# Patient Record
Sex: Female | Born: 1990 | Race: Black or African American | Hispanic: No | Marital: Single | Smoking: Never smoker
Health system: Southern US, Community
[De-identification: ages and names within clinical notes are randomized; demographics above are authoritative.]

## PROBLEM LIST (undated history)

## (undated) DIAGNOSIS — D649 Anemia, unspecified: Secondary | ICD-10-CM

## (undated) DIAGNOSIS — G43909 Migraine, unspecified, not intractable, without status migrainosus: Secondary | ICD-10-CM

## (undated) HISTORY — DX: Anemia, unspecified: D64.9

## (undated) HISTORY — DX: Migraine, unspecified, not intractable, without status migrainosus: G43.909

---
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
## 2008-06-02 ENCOUNTER — Emergency Department (HOSPITAL_COMMUNITY): Admission: EM | Admit: 2008-06-02 | Discharge: 2008-06-02 | Payer: Self-pay | Admitting: Emergency Medicine

## 2009-07-01 ENCOUNTER — Emergency Department (HOSPITAL_COMMUNITY): Admission: EM | Admit: 2009-07-01 | Discharge: 2009-07-01 | Payer: Self-pay | Admitting: Emergency Medicine

## 2010-03-08 ENCOUNTER — Emergency Department (HOSPITAL_COMMUNITY): Admission: EM | Admit: 2010-03-08 | Discharge: 2010-03-09 | Payer: Self-pay | Admitting: Emergency Medicine

## 2010-10-02 ENCOUNTER — Emergency Department (HOSPITAL_COMMUNITY): Admission: EM | Admit: 2010-10-02 | Discharge: 2010-10-02 | Payer: Self-pay | Admitting: Emergency Medicine

## 2010-12-25 DIAGNOSIS — G43909 Migraine, unspecified, not intractable, without status migrainosus: Secondary | ICD-10-CM

## 2010-12-25 HISTORY — DX: Migraine, unspecified, not intractable, without status migrainosus: G43.909

## 2011-02-01 ENCOUNTER — Emergency Department (HOSPITAL_COMMUNITY): Payer: BC Managed Care – PPO

## 2011-02-01 ENCOUNTER — Emergency Department (HOSPITAL_COMMUNITY)
Admission: EM | Admit: 2011-02-01 | Discharge: 2011-02-01 | Disposition: A | Discharge status: Home / Self Care | Payer: BC Managed Care – PPO | Attending: Emergency Medicine | Admitting: Emergency Medicine

## 2011-02-01 DIAGNOSIS — R079 Chest pain, unspecified: Secondary | ICD-10-CM | POA: Insufficient documentation

## 2011-03-24 ENCOUNTER — Other Ambulatory Visit (INDEPENDENT_AMBULATORY_CARE_PROVIDER_SITE_OTHER): Payer: BC Managed Care – PPO

## 2011-03-24 ENCOUNTER — Ambulatory Visit (INDEPENDENT_AMBULATORY_CARE_PROVIDER_SITE_OTHER): Payer: BC Managed Care – PPO | Admitting: Internal Medicine

## 2011-03-24 ENCOUNTER — Encounter: Payer: Self-pay | Admitting: Internal Medicine

## 2011-03-24 DIAGNOSIS — I319 Disease of pericardium, unspecified: Secondary | ICD-10-CM | POA: Insufficient documentation

## 2011-03-24 DIAGNOSIS — R079 Chest pain, unspecified: Secondary | ICD-10-CM

## 2011-03-24 DIAGNOSIS — D649 Anemia, unspecified: Secondary | ICD-10-CM

## 2011-03-24 DIAGNOSIS — R0781 Pleurodynia: Secondary | ICD-10-CM | POA: Insufficient documentation

## 2011-03-24 LAB — CBC WITH DIFFERENTIAL/PLATELET
Basophils Relative: 0.4 % (ref 0.0–3.0)
Eosinophils Relative: 1.6 % (ref 0.0–5.0)
HCT: 39.2 % (ref 36.0–46.0)
Hemoglobin: 12.3 g/dL (ref 12.0–15.0)
Lymphs Abs: 1.3 10*3/uL (ref 0.7–4.0)
MCV: 77.6 fl — ABNORMAL LOW (ref 78.0–100.0)
Monocytes Absolute: 0.3 10*3/uL (ref 0.1–1.0)
Monocytes Relative: 8.5 % (ref 3.0–12.0)
Neutro Abs: 2.2 10*3/uL (ref 1.4–7.7)
Platelets: 205 10*3/uL (ref 150.0–400.0)
RBC: 5.05 Mil/uL (ref 3.87–5.11)
WBC: 3.9 10*3/uL — ABNORMAL LOW (ref 4.5–10.5)

## 2011-03-24 LAB — COMPREHENSIVE METABOLIC PANEL
Alkaline Phosphatase: 47 U/L (ref 39–117)
BUN: 8 mg/dL (ref 6–23)
CO2: 29 mEq/L (ref 19–32)
Creatinine, Ser: 0.7 mg/dL (ref 0.4–1.2)
GFR: 130.86 mL/min (ref >60.00)
Glucose, Bld: 94 mg/dL (ref 70–99)
Total Bilirubin: 0.6 mg/dL (ref 0.3–1.2)
Total Protein: 6.6 g/dL (ref 6.0–8.3)

## 2011-03-24 LAB — SEDIMENTATION RATE: Sed Rate: 6 mm/hr (ref 0–22)

## 2011-03-24 MED ORDER — IBUPROFEN 100 MG/5ML PO SUSP: 200.0000 mg | ORAL | Status: DC | PRN

## 2011-03-24 NOTE — Progress Notes (Signed)
Subjective:    Patient ID: Lindsey Webster, female    DOB: 03/06/1991, 20 y.o.   MRN: [id number]  Chest Pain  This is a new problem. The current episode started more than 1 month ago. The onset quality is gradual. The problem occurs every several days. The problem has been unchanged. The pain is present in the substernal region. The pain is at a severity of 2/10. The pain is mild. The quality of the pain is described as sharp. The pain does not radiate. Pertinent negatives include no abdominal pain, back pain, claudication, cough, diaphoresis, dizziness, exertional chest pressure, fever, headaches, hemoptysis, irregular heartbeat, leg pain, lower extremity edema, malaise/fatigue, nausea, near-syncope, numbness, orthopnea, palpitations, PND, shortness of breath, sputum production, syncope, vomiting or weakness. The pain is aggravated by breathing and movement. She has tried nothing for the symptoms. Risk factors include no known risk factors.  Pertinent negatives for past medical history include no seizures and no valve disorder.  Pertinent negatives for family medical history include: no CAD in family, no connective tissue disease in family and no early MI in family.   She was seen at the hospital in 02/01/11 and all of her testing was normal and she tells me that she was told that she has pericarditis.   Review of Systems  Constitutional: Negative for fever, chills, malaise/fatigue, diaphoresis, activity change, appetite change, fatigue and unexpected weight change.  HENT: Negative for facial swelling and neck pain.   Respiratory: Negative for apnea, cough, hemoptysis, sputum production, choking, chest tightness, shortness of breath, wheezing and stridor.   Cardiovascular: Positive for chest pain. Negative for palpitations, orthopnea, claudication, leg swelling, syncope, PND and near-syncope.  Gastrointestinal: Negative for nausea, vomiting, abdominal pain, diarrhea, constipation and abdominal  distention.  Genitourinary: Negative for hematuria and difficulty urinating.  Musculoskeletal: Negative for back pain.  Skin: Negative for color change, pallor and rash.  Neurological: Negative for dizziness, tremors, seizures, syncope, weakness, light-headedness, numbness and headaches.  Hematological: Negative for adenopathy. Does not bruise/bleed easily.  Psychiatric/Behavioral: Negative for behavioral problems, confusion, dysphoric mood, decreased concentration and agitation.       Objective:   Physical Exam  Constitutional: She is oriented to person, place, and time. She appears well-developed and well-nourished. No distress.  HENT:  Head: Normocephalic and atraumatic.  Right Ear: External ear normal.  Left Ear: External ear normal.  Nose: Nose normal.  Mouth/Throat: Oropharynx is clear and moist. No oropharyngeal exudate.  Eyes: Conjunctivae and EOM are normal. Pupils are equal, round, and reactive to light. Right eye exhibits no discharge. Left eye exhibits no discharge. No scleral icterus.  Neck: Normal range of motion. Neck supple. No thyromegaly present.  Cardiovascular: Normal rate, regular rhythm, normal heart sounds, intact distal pulses and normal pulses.  PMI is not displaced.  Exam reveals no gallop and no friction rub.   No murmur heard. Pulmonary/Chest: Effort normal and breath sounds normal. No respiratory distress. She has no wheezes. She has no rales. She exhibits no tenderness.  Abdominal: Soft. Bowel sounds are normal. She exhibits no distension. There is no tenderness. There is no rebound and no guarding.  Musculoskeletal: Normal range of motion. She exhibits no edema and no tenderness.  Lymphadenopathy:    She has no cervical adenopathy.  Neurological: She is oriented to person, place, and time. She has normal reflexes. She displays normal reflexes. No cranial nerve deficit. She exhibits normal muscle tone. Coordination normal.  Skin: Skin is warm and dry. No  rash noted. She  is not diaphoretic. No erythema (some tatoos). No pallor.  Psychiatric: She has a normal mood and affect. Her behavior is normal. Judgment and thought content normal.          Assessment & Plan:

## 2011-03-24 NOTE — Patient Instructions (Signed)
Pericarditis Pericarditis is an inflammation (soreness or redness) of the sac (almost like a bag) surrounding the heart, starting at the large vessels at the top of the heart, shown in the picture, and wrapping down around the heart. The inside of this sac is very smooth so the heart can beat and slide easily within this protective membrane. The outside of the sac is a tougher layer of material. This sac may become inflamed by a number of different problems. Pericarditis is usually accompanied by pain in the chest which radiates to the shoulder, back, and upper and middle belly (abdomen). There may be shortness of breath and swelling of the abdomen. Sometimes fluid collects around the heart. When this happens the heart cannot beat as well. When your heart is unable to work as well, you will not feel well. You have may have shortness of breath (dyspnea) with exertion such as climbing stairs. The kidneys do not work as well so you may retain fluid. This is also one of the reasons your lower legs and ankles swell. The first sign you will usually recognize is chest pain. You will also usually get a rapid heartbeat. You may have sudden unexplained weight gain of ten to fifteen pounds. You may get short of breath while sleeping. The heart actually has to work harder while you are lying down. This may also produce a night cough. It may help to sleep with two or more pillows. With treatment these symptoms usually improve rapidly. Upon discharge from this location, weigh yourself after arriving home. Record your weight daily at the same time. This will give some indication as to your progress. As you get better your weight will usually go down. Follow a low sodium diet. CAUSES   Medicine side effects.   Radiation as received for treatment of cancer.   Tumors or cancer themselves.   Infections. These may be caused by viruses, germs (bacteria) or fungi.   Tuberculosis (this is also an infection).   Autoimmune  disorders such as lupus, scleroderma, and rheumatoid arthritis.  DIAGNOSIS The diagnosis of pericarditis is made by history (asking the patient what seems to be the problem) and physical exam (looking and listening to the patient by the caregiver). Blood tests may need to be done. Often specialized tests such as echocardiogram (pictures which are taken by bouncing sound waves off the heart), CT scans of the heart and chest, and perhaps other tests depending on what is happening, may be done. TREATMENT  Treatment of pericarditis will usually include medicine to relieve pain.   If water retention is present, water reducing pills (diuretics) may be given to get rid of the water accumulation.   If the pericarditis is caused by an infection which can be treated, medicine which kill germs (antibiotics) will be started. If the infection is caused by a fungus or tuberculosis, treatment may be necessary for very long times.   If the infection is caused by a virus it will usually run its course and cause no further problems. When a virus is the cause, anti-inflammatory medicines (medicine against soreness) will often be given.   If complications occur from pericarditis, such as scarring of the pericardial sac following the inflammation, surgery is rarely needed to remove the sac. The sac surrounding the heart is not necessary for life. When it becomes scarred, it makes it more difficult for the heart to beat normally.  HOME CARE INSTRUCTIONS  Follow the treatment plan your caregiver prescribes. They can help you  with your post hospital care program. Follow your caregiver's advice regarding dieting and exercise. Eat a low fat diet. Use alcohol only as directed. Take all medicines as directed.   Eat a nutritious diet low in fat and sodium.   Try to maintain an ideal weight. Your caregivers can help you with this.   Exercise as instructed.   Wear a medical alert bracelet if recommended by your caregiver.     Keep medicine with you, including a list with dosages, in case of an emergency.   Stop smoking.  SEEK IMMEDIATE MEDICAL CARE IF:  Chest pain.  Vomiting.   Sweating (diaphoresis).   Irregular heart beat (palpitations).   Racing heart.   Fainting episodes.   Feeling sick to your stomach (nausea).   Weakness.   If you develop any of the symptoms (complaints, problems) which originally brought you into the clinic, hospital, or emergency department, call for local emergency medical help. Do not drive yourself to the hospital. Document Released: 06/06/2001 Document Re-Released: 01/02/2010 [facility] Patient Information 2011 [place], [place].

## 2011-03-25 DIAGNOSIS — D509 Iron deficiency anemia, unspecified: Secondary | ICD-10-CM | POA: Insufficient documentation

## 2011-03-25 LAB — C-REACTIVE PROTEIN: CRP: 0 mg/dL (ref <0.6)

## 2011-03-25 NOTE — Assessment & Plan Note (Signed)
EKG is wnl, will treat the pericarditis

## 2011-03-25 NOTE — Assessment & Plan Note (Signed)
Will start some nsaids, she wants a liquid, she does not want to see a Cardiologist, I will check labs to look for an inflammatory process and/or infection, she has no s/s of tamponade, she and her parents were given pt ed material

## 2011-03-25 NOTE — Assessment & Plan Note (Signed)
She has no s/s of blood loss, will check her CBC today

## 2011-03-28 ENCOUNTER — Encounter: Payer: Self-pay | Admitting: Internal Medicine

## 2011-04-02 LAB — POCT I-STAT, CHEM 8
Creatinine, Ser: 0.8 mg/dL (ref 0.4–1.2)
Glucose, Bld: 92 mg/dL (ref 70–99)
HCT: 43 % (ref 36.0–46.0)
Hemoglobin: 14.6 g/dL (ref 12.0–15.0)
TCO2: 26 mmol/L (ref 0–100)

## 2011-04-02 LAB — URINE CULTURE
Colony Count: NO GROWTH
Culture: NO GROWTH

## 2011-04-02 LAB — URINE MICROSCOPIC-ADD ON

## 2011-04-02 LAB — URINALYSIS, ROUTINE W REFLEX MICROSCOPIC
Glucose, UA: NEGATIVE mg/dL
Hgb urine dipstick: NEGATIVE
Protein, ur: NEGATIVE mg/dL
Urobilinogen, UA: 1 mg/dL (ref 0.0–1.0)

## 2011-10-09 ENCOUNTER — Emergency Department (HOSPITAL_COMMUNITY)
Admission: EM | Admit: 2011-10-09 | Discharge: 2011-10-09 | Disposition: A | Discharge status: Home / Self Care | Payer: BC Managed Care – PPO | Attending: Emergency Medicine | Admitting: Emergency Medicine

## 2011-10-09 DIAGNOSIS — J45909 Unspecified asthma, uncomplicated: Secondary | ICD-10-CM | POA: Insufficient documentation

## 2011-10-09 DIAGNOSIS — H5789 Other specified disorders of eye and adnexa: Secondary | ICD-10-CM | POA: Insufficient documentation

## 2011-10-09 DIAGNOSIS — S0500XA Injury of conjunctiva and corneal abrasion without foreign body, unspecified eye, initial encounter: Secondary | ICD-10-CM | POA: Insufficient documentation

## 2011-10-09 DIAGNOSIS — X58XXXA Exposure to other specified factors, initial encounter: Secondary | ICD-10-CM | POA: Insufficient documentation

## 2012-01-05 ENCOUNTER — Emergency Department (HOSPITAL_COMMUNITY)
Admission: EM | Admit: 2012-01-05 | Discharge: 2012-01-06 | Disposition: A | Discharge status: Home / Self Care | Payer: BC Managed Care – PPO | Attending: Emergency Medicine | Admitting: Emergency Medicine

## 2012-01-05 ENCOUNTER — Encounter (HOSPITAL_COMMUNITY): Payer: Self-pay | Admitting: *Deleted

## 2012-01-05 DIAGNOSIS — Y990 Civilian activity done for income or pay: Secondary | ICD-10-CM | POA: Insufficient documentation

## 2012-01-05 DIAGNOSIS — J45909 Unspecified asthma, uncomplicated: Secondary | ICD-10-CM | POA: Insufficient documentation

## 2012-01-05 DIAGNOSIS — IMO0002 Reserved for concepts with insufficient information to code with codable children: Secondary | ICD-10-CM | POA: Insufficient documentation

## 2012-01-05 DIAGNOSIS — X58XXXA Exposure to other specified factors, initial encounter: Secondary | ICD-10-CM | POA: Insufficient documentation

## 2012-01-05 DIAGNOSIS — M79609 Pain in unspecified limb: Secondary | ICD-10-CM | POA: Insufficient documentation

## 2012-01-05 DIAGNOSIS — Y9289 Other specified places as the place of occurrence of the external cause: Secondary | ICD-10-CM | POA: Insufficient documentation

## 2012-01-05 DIAGNOSIS — T148XXA Other injury of unspecified body region, initial encounter: Secondary | ICD-10-CM

## 2012-01-05 MED ORDER — IBUPROFEN 600 MG PO TABS: 600.0000 mg | ORAL_TABLET | Freq: Four times a day (QID) | ORAL | Status: DC | PRN

## 2012-01-05 MED ORDER — IBUPROFEN 800 MG PO TABS
800.0000 mg | ORAL_TABLET | Freq: Once | ORAL | Status: AC
  Administered 2012-01-05: 800 mg via ORAL
  Filled 2012-01-05: qty 1

## 2012-01-05 NOTE — Progress Notes (Signed)
Orthopedic Tech Progress Note Patient Details:  Lindsey Webster [birth year] [id number]  Other Ortho Devices Type of Ortho Device: Ace wrap Ortho Device Location: left arm   Shawnie Pons 01/05/2012, 12:51 PM

## 2012-01-05 NOTE — ED Provider Notes (Signed)
History     CSN: [id number]  Arrival date & time 01/05/12  1207   First MD Initiated Contact with Patient 01/05/12 1219      Chief Complaint  Patient presents with  . Arm Injury   patient presents with left forearm pain, stated was a work injury. 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 She essentially describes repetitive motion that causes pain, injury, and strain to her left forearm. There was no direct trauma. No redness or ecchymoses. The pain extends from the proximal forearm down to the wrist. She's had no numbness, tingling, or weakness. She states she did not take any medications prior to arrival. Patient is right-handed  (Consider location/radiation/quality/duration/timing/severity/associated sxs/prior treatment) HPI  Past Medical History  Diagnosis Date  . Asthma   . Anemia     History reviewed. No pertinent past surgical history.  Family History  Problem Relation Age of Onset  . Colon cancer Mother   . Arthritis Father   . Stroke Maternal Grandfather   . Heart disease Paternal Grandmother     History  Substance Use Topics  . Smoking status: Never Smoker   . Smokeless tobacco: Not on file  . Alcohol Use: 0.6 oz/week    1 Shots of liquor per week    OB History    Grav Para Term Preterm Abortions TAB SAB Ect Mult Living                  Review of Systems  Constitutional: Negative for chills.  Musculoskeletal: Negative for back pain.  Neurological: Negative for weakness.    Allergies  Review of patient's allergies indicates no known allergies.  Home Medications   Current Outpatient Rx  Name Route Sig Dispense Refill  . IBUPROFEN 100 MG/5ML PO SUSP Oral Take 10 mLs (200 mg total) by mouth every 4 (four) hours as needed for fever. 473 mL 1    BP 130/70  Pulse 62  Temp(Src) 98.2 F (36.8 C) (Oral)  Resp 16  SpO2 99%  Physical Exam  Nursing note and vitals  reviewed. Constitutional: She appears well-developed and well-nourished. No distress.  HENT:  Head: Normocephalic.  Eyes: Pupils are equal, round, and reactive to light.  Cardiovascular: Normal heart sounds.   Pulmonary/Chest: Breath sounds normal.  Abdominal: Soft.  Musculoskeletal: Normal range of motion. She exhibits tenderness.       Examination of left forearm shows intact  Peripheral pulses, good cap refill. There is some mild tenderness in the muscles and tendons of the forearm. There is no ecchymoses, no redness, no deformity, no crepitus,  no bony deformity. There is no significant swelling appreciated.  Neurological: She is alert.  Skin: Skin is warm and dry.    ED Course  Procedures (including critical care time)  Labs Reviewed - No data to display No results found.   No diagnosis found.    MDM  Pt is seen and examined;  Initial history and physical completed.  Will follow.     Will treat for tendinitis and muscle strain. We'll provide work note for light duty for next several days. Ibuprofen ice, Ace wrap for comfort     Mikahla Wisor A. Patrica Duel, MD 01/05/12 1225

## 2012-01-05 NOTE — ED Notes (Signed)
To ed for eval of right arm pain. Unknown injury

## 2013-01-21 ENCOUNTER — Ambulatory Visit (INDEPENDENT_AMBULATORY_CARE_PROVIDER_SITE_OTHER): Payer: Managed Care, Other (non HMO) | Admitting: Physician Assistant

## 2013-01-21 VITALS — BP 111/73 | HR 99 | Temp 99.0°F | Resp 18 | Ht 63.0 in | Wt 140.0 lb

## 2013-01-21 DIAGNOSIS — J029 Acute pharyngitis, unspecified: Secondary | ICD-10-CM

## 2013-01-21 DIAGNOSIS — J111 Influenza due to unidentified influenza virus with other respiratory manifestations: Secondary | ICD-10-CM

## 2013-01-21 DIAGNOSIS — R6889 Other general symptoms and signs: Secondary | ICD-10-CM

## 2013-01-21 DIAGNOSIS — D649 Anemia, unspecified: Secondary | ICD-10-CM

## 2013-01-21 LAB — POCT CBC
Granulocyte percent: 86.9 %G — AB (ref 37–80)
MID (cbc): 0.5 (ref 0–0.9)
MPV: 10.8 fL (ref 0–99.8)
POC Granulocyte: 11.7 — AB (ref 2–6.9)
POC LYMPH PERCENT: 9.4 %L — AB (ref 10–50)
POC MID %: 3.7 %M (ref 0–12)
Platelet Count, POC: 208 10*3/uL (ref 142–424)
RBC: 4.8 M/uL (ref 4.04–5.48)
RDW, POC: 16.5 %

## 2013-01-21 LAB — FERRITIN: Ferritin: 25 ng/mL (ref 10–291)

## 2013-01-21 LAB — IRON AND TIBC
Iron: 12 ug/dL — ABNORMAL LOW (ref 42–145)
UIBC: 267 ug/dL (ref 125–400)

## 2013-01-21 MED ORDER — AMOXICILLIN 875 MG PO TABS: 875.0000 mg | ORAL_TABLET | Freq: Two times a day (BID) | ORAL | Status: DC

## 2013-01-21 MED ORDER — FERROUS SULFATE 324 (65 FE) MG PO TBEC: 324.0000 mg | DELAYED_RELEASE_TABLET | Freq: Three times a day (TID) | ORAL | Status: DC

## 2013-01-21 NOTE — Progress Notes (Signed)
Subjective:    Patient ID: Lindsey Webster, female    DOB: March 08, 1991, 22 y.o.   MRN: [id number]  HPI   Lindsey Webster is a 22 yr old female because she feels terrible.  States she has chills but feels hot, has had a headache for 2 days, body aches, sore throat.  States she has not had a fever, does not have a thermometer at home to check.  No cough.  Minimal runny nose.  Some nausea but no vomiting.  No flu shot this year.  Has not taken anything for symptoms yet.    Pt would like to be tested for diabetes today as well.  She is asymptomatic but has +fam hx for DM.   Review of Systems  Constitutional: Positive for chills. Negative for fever.  HENT: Positive for sore throat and rhinorrhea.   Respiratory: Negative for cough, shortness of breath and wheezing.   Cardiovascular: Negative.   Gastrointestinal: Positive for nausea. Negative for vomiting, abdominal pain and diarrhea.  Musculoskeletal: Positive for myalgias and arthralgias.  Skin: Negative.   Neurological: Positive for headaches.       Objective:   Physical Exam  Vitals reviewed. Constitutional: She is oriented to person, place, and time. She appears well-developed and well-nourished. No distress.  HENT:  Head: Normocephalic and atraumatic.  Right Ear: Tympanic membrane and ear canal normal.  Left Ear: Tympanic membrane and ear canal normal.  Nose: Mucosal edema and rhinorrhea present. Right sinus exhibits no maxillary sinus tenderness and no frontal sinus tenderness. Left sinus exhibits no maxillary sinus tenderness and no frontal sinus tenderness.  Mouth/Throat: Uvula is midline and mucous membranes are normal. Posterior oropharyngeal erythema present. No oropharyngeal exudate.  Eyes: Conjunctivae normal are normal. No scleral icterus.  Neck: Neck supple.  Cardiovascular: Normal rate, regular rhythm and normal heart sounds.  Exam reveals no gallop and no friction rub.   No murmur heard. Pulmonary/Chest: Effort normal and breath  sounds normal. She has no wheezes. She has no rales.  Lymphadenopathy:    She has cervical adenopathy (tender).  Neurological: She is alert and oriented to person, place, and time.  Skin: Skin is warm and dry.  Psychiatric: She has a normal mood and affect. Her behavior is normal.     Filed Vitals:   01/21/13 1006  BP: 111/73  Pulse: 99  Temp: 99 F (37.2 C)  Resp: 18     Results for orders placed in visit on 01/21/13  POCT INFLUENZA A/B      Component Value Range   Influenza A, POC Negative     Influenza B, POC Negative    POCT RAPID STREP A (OFFICE)      Component Value Range   Rapid Strep A Screen Negative  Negative  POCT CBC      Component Value Range   WBC 13.5 (*) 4.6 - 10.2 K/uL   Lymph, poc 1.3  0.6 - 3.4   POC LYMPH PERCENT 9.4 (*) 10 - 50 %L   MID (cbc) 0.5  0 - 0.9   POC MID % 3.7  0 - 12 %M   POC Granulocyte 11.7 (*) 2 - 6.9   Granulocyte percent 86.9 (*) 37 - 80 %G   RBC 4.80  4.04 - 5.48 M/uL   Hemoglobin 11.1 (*) 12.2 - 16.2 g/dL   HCT, POC 40.9 (*) 81.1 - 47.9 %   MCV 77.0 (*) 80 - 97 fL   MCH, POC 23.1 (*) 27 - 31.2 pg  MCHC 30.0 (*) 31.8 - 35.4 g/dL   RDW, POC 40.9     Platelet Count, POC 208  142 - 424 K/uL   MPV 10.8  0 - 99.8 fL        Assessment & Plan:   1. Flu-like symptoms  POCT Influenza A/B, POCT CBC  2. Sore throat  POCT rapid strep A, POCT CBC, amoxicillin (AMOXIL) 875 MG tablet, Culture, Group A Strep  3. Anemia  Ferritin, Iron and TIBC, ferrous sulfate 324 (65 FE) MG TBEC    Lindsey Webster is a 22 yr old female here with pharyngitis and anemia.  Rapid flu and rapid strep are negative today.  Throat culture sent.  CBC reveals a leukocytosis with left shift.  Will initiate amoxicillin today to cover for strep pharyngitis.  Will have her recheck in 24-48 hours to ensure she is improving.  Additionally CBC reveals a microcytic anemia.  I have added on iron studies and started her on iron supplementation.  Will follow-up on labs and make  adjustments if necessary.  Encouraged her to schedule an appointment for CPE in 4-6 weeks to discuss concern for DM and for wellness exam.  Pt understands and is in agreement.

## 2013-01-21 NOTE — Patient Instructions (Addendum)
Begin taking the antibiotic as directed.  Take with food to reduce stomach upset.  Plenty of fluids and rest.  Please return in 24-48 hours to recheck and make sure you are responding to the antibiotics.  I am sending out additional blood work because your CBC showed that you are anemic.  I will let you know what these show.  For now, begin taking the iron supplement 3 times daily between meals.  I would encouraged to schedule an appointment for a physical within the next 4-6 weeks  Please let us know if you are worsening or not improving.

## 2013-01-23 LAB — CULTURE, GROUP A STREP: Organism ID, Bacteria: NORMAL

## 2013-04-23 ENCOUNTER — Emergency Department (HOSPITAL_COMMUNITY)
Admission: EM | Admit: 2013-04-23 | Discharge: 2013-04-23 | Disposition: A | Discharge status: Home / Self Care | Payer: Managed Care, Other (non HMO) | Attending: Emergency Medicine | Admitting: Emergency Medicine

## 2013-04-23 ENCOUNTER — Encounter (HOSPITAL_COMMUNITY): Payer: Self-pay | Admitting: Emergency Medicine

## 2013-04-23 ENCOUNTER — Emergency Department (HOSPITAL_COMMUNITY): Payer: Managed Care, Other (non HMO)

## 2013-04-23 ENCOUNTER — Other Ambulatory Visit: Payer: Self-pay

## 2013-04-23 DIAGNOSIS — Z8709 Personal history of other diseases of the respiratory system: Secondary | ICD-10-CM | POA: Insufficient documentation

## 2013-04-23 DIAGNOSIS — Z862 Personal history of diseases of the blood and blood-forming organs and certain disorders involving the immune mechanism: Secondary | ICD-10-CM | POA: Insufficient documentation

## 2013-04-23 DIAGNOSIS — R209 Unspecified disturbances of skin sensation: Secondary | ICD-10-CM | POA: Insufficient documentation

## 2013-04-23 DIAGNOSIS — Z711 Person with feared health complaint in whom no diagnosis is made: Secondary | ICD-10-CM | POA: Insufficient documentation

## 2013-04-23 DIAGNOSIS — R0602 Shortness of breath: Secondary | ICD-10-CM | POA: Insufficient documentation

## 2013-04-23 NOTE — ED Notes (Signed)
See triage note. Pt reports SOB with deep inspiration. Was sent home from work yesterday because of diaphoresis and near syncope. Pt works indoors. Reports headache x3 days. Family hx of HTN. Pain 8/10 upon inspiration and headache.

## 2013-04-23 NOTE — ED Provider Notes (Signed)
History     CSN: [id number]  Arrival date & time 04/23/13  4098   First MD Initiated Contact with Patient 04/23/13 1017      Chief Complaint  Patient presents with  . Hypertension  . Numbness  . SOB with deep inspiration     (Consider location/radiation/quality/duration/timing/severity/associated sxs/prior treatment) HPI  22 year old female presents with multiple complaints. Patient reports while at work yesterday she felt shortness of breath, follows with lightheadedness, and feeling clammy. She was not exerting herself at that time. States she was sent home due to the symptoms. Her mom checked her blood pressure and was elevated with a systolic of 160s. The BP concerns her and brought her to the ER. She also reports having an in and and a throbbing headache to her for head, along with pain to the chest when she takes a deep breath. She experiencing pins and needles sensation radiates to her left arm down to the third and fourth finger. States the fingers are extra sensitive.  Patient states the tingling sensation has been lasting for the past hour since she has been in the ER.  Patient denies fever, chills, vision changes, neck pain, and dyspnea on exertion, cough, hemoptysis, and abdominal pain, nausea, vomiting, diarrhea, or weakness. She denies taking birth control pill, having recent surgery, prolonged bed rest, prior PE DVT, leg swelling or calf pain. She is a nonsmoker. She is concerned about blood pressure because she has a strong family history of hypertension. No family history of premature cardiac death. She also has a history of juvenile asthma but has not needed to use an inhaler for many years. She denies any changes in her environment.    Past Medical History  Diagnosis Date  . Asthma   . Anemia     No past surgical history on file.  Family History  Problem Relation Age of Onset  . Colon cancer Mother   . Arthritis Father   . Stroke Maternal Grandfather   . Heart  disease Paternal Grandmother     History  Substance Use Topics  . Smoking status: Never Smoker   . Smokeless tobacco: Not on file  . Alcohol Use: 0.6 oz/week    1 Shots of liquor per week    OB History   Grav Para Term Preterm Abortions TAB SAB Ect Mult Living                  Review of Systems  Constitutional:       10 Systems reviewed and all are negative for acute change except as noted in the HPI.     Allergies  Review of patient's allergies indicates no known allergies.  Home Medications  No current outpatient prescriptions on file.  BP 115/69  Pulse 57  Temp(Src) 98.3 F (36.8 C) (Oral)  Resp 16  SpO2 100%  LMP 04/16/2013  Physical Exam  Nursing note and vitals reviewed. Constitutional: She is oriented to person, place, and time. She appears well-developed and well-nourished. No distress.  Awake, alert, nontoxic appearance  HENT:  Head: Atraumatic.  Eyes: Conjunctivae are normal. Right eye exhibits no discharge. Left eye exhibits no discharge.  Neck: Neck supple.  Cardiovascular: Normal rate, regular rhythm and intact distal pulses.   Pulmonary/Chest: Effort normal. No respiratory distress. She has no wheezes. She has no rales. She exhibits no tenderness.  Abdominal: Soft. There is no tenderness. There is no rebound.  Musculoskeletal: Normal range of motion. She exhibits no edema and no tenderness.  ROM appears intact, no obvious focal weakness  Neurological: She is alert and oriented to person, place, and time.  Mental status and motor strength appears intact  Normal sensation throughout both arms along major nerve distribution.  Subjective hypethesia to 3rd-4th fingers in L hand.  Normal grip strength and FROM.     Skin: No rash noted.  Psychiatric: She has a normal mood and affect.    ED Course  Procedures (including critical care time)   Date: 04/23/2013  Rate: 58  Rhythm: normal sinus rhythm  QRS Axis: normal  Intervals: normal  ST/T Wave  abnormalities: normal  Conduction Disutrbances: none  Narrative Interpretation:   Old EKG Reviewed: No significant changes noted     10:43 AM Patient presents complaining of high blood pressure. Her blood pressure in the ED has been normotensive. She has no acute finding on exam. She is in no acute distress. Lungs clear she is no acute respiratory distress. She has no significant risk factor for PE. She has TIMI score of 0.    11:31 AM Normal ECG, normal CXR.  Reassurance given.  Return precaution discussed.  Resource provide for PCP f/u.    Labs Reviewed - No data to display Dg Chest 2 View  04/23/2013  *RADIOLOGY REPORT*  Clinical Data: Shortness of breath, left arm pain.  Dizziness.  CHEST - 2 VIEW  Comparison: 02/01/2011  Findings: Heart and mediastinal contours are within normal limits. No focal opacities or effusions.  No acute bony abnormality.  IMPRESSION: No active cardiopulmonary disease.   Original Report Authenticated By: Charlett Nose, M.D.      1. Physically well but worried       MDM  BP 115/69  Pulse 57  Temp(Src) 98.3 F (36.8 C) (Oral)  Resp 16  SpO2 100%  LMP 04/16/2013  I have reviewed nursing notes and vital signs. I personally reviewed the imaging tests through PACS system  I reviewed available ER/hospitalization records thought the EMR         Fayrene Helper, [place] 04/23/13 1134

## 2013-04-23 NOTE — ED Provider Notes (Signed)
Medical screening examination/treatment/procedure(s) were performed by non-physician practitioner and as supervising physician I was immediately available for consultation/collaboration.   Suzi Roots, MD 04/23/13 [phone]

## 2013-04-23 NOTE — ED Notes (Signed)
Pt escorted to discharge window. Pt verbalized understanding discharge instructions. In no acute distress.  

## 2013-04-23 NOTE — ED Notes (Signed)
Pt states that she is having headaches x 3 days.  Checked her blood pressure this morning 162/91.  States that now her left arm feels like pins and needles.  Has not been dx w/ HTN but stong family hx.

## 2013-05-02 ENCOUNTER — Encounter: Payer: Self-pay | Admitting: Internal Medicine

## 2013-05-02 ENCOUNTER — Other Ambulatory Visit (INDEPENDENT_AMBULATORY_CARE_PROVIDER_SITE_OTHER): Payer: Managed Care, Other (non HMO)

## 2013-05-02 ENCOUNTER — Ambulatory Visit (INDEPENDENT_AMBULATORY_CARE_PROVIDER_SITE_OTHER): Payer: Managed Care, Other (non HMO) | Admitting: Internal Medicine

## 2013-05-02 VITALS — BP 110/70 | HR 64 | Temp 98.8°F | Resp 16 | Ht 62.0 in | Wt 133.0 lb

## 2013-05-02 DIAGNOSIS — I319 Disease of pericardium, unspecified: Secondary | ICD-10-CM

## 2013-05-02 DIAGNOSIS — D509 Iron deficiency anemia, unspecified: Secondary | ICD-10-CM

## 2013-05-02 DIAGNOSIS — R079 Chest pain, unspecified: Secondary | ICD-10-CM

## 2013-05-02 LAB — IBC PANEL
Iron: 80 ug/dL (ref 42–145)
Saturation Ratios: 25.1 % (ref 20.0–50.0)
Transferrin: 227.7 mg/dL (ref 212.0–360.0)

## 2013-05-02 LAB — CBC WITH DIFFERENTIAL/PLATELET
Eosinophils Relative: 1.1 % (ref 0.0–5.0)
Lymphocytes Relative: 31.7 % (ref 12.0–46.0)
MCV: 74.8 fl — ABNORMAL LOW (ref 78.0–100.0)
Monocytes Absolute: 0.3 10*3/uL (ref 0.1–1.0)
Monocytes Relative: 5.7 % (ref 3.0–12.0)
Neutrophils Relative %: 61 % (ref 43.0–77.0)
Platelets: 222 10*3/uL (ref 150.0–400.0)
RBC: 5.21 Mil/uL — ABNORMAL HIGH (ref 3.87–5.11)
WBC: 5.1 10*3/uL (ref 4.5–10.5)

## 2013-05-02 LAB — SEDIMENTATION RATE: Sed Rate: 7 mm/hr (ref 0–22)

## 2013-05-02 MED ORDER — IBUPROFEN 100 MG/5ML PO SUSP: 200.0000 mg | ORAL | Status: DC | PRN

## 2013-05-02 MED ORDER — IBUPROFEN 600 MG PO TABS: 600.0000 mg | ORAL_TABLET | Freq: Four times a day (QID) | ORAL | Status: DC | PRN

## 2013-05-02 NOTE — Patient Instructions (Signed)
Chest Pain (Nonspecific) It is often hard to give a specific diagnosis for the cause of chest pain. There is always a chance that your pain could be related to something serious, such as a heart attack or a blood clot in the lungs. You need to follow up with your caregiver for further evaluation. CAUSES   Heartburn.  Pneumonia or bronchitis.  Anxiety or stress.  Inflammation around your heart (pericarditis) or lung (pleuritis or pleurisy).  A blood clot in the lung.  A collapsed lung (pneumothorax). It can develop suddenly on its own (spontaneous pneumothorax) or from injury (trauma) to the chest.  Shingles infection (herpes zoster virus). The chest wall is composed of bones, muscles, and cartilage. Any of these can be the source of the pain.  The bones can be bruised by injury.  The muscles or cartilage can be strained by coughing or overwork.  The cartilage can be affected by inflammation and become sore (costochondritis). DIAGNOSIS  Lab tests or other studies, such as X-rays, electrocardiography, stress testing, or cardiac imaging, may be needed to find the cause of your pain.  TREATMENT   Treatment depends on what may be causing your chest pain. Treatment may include:  Acid blockers for heartburn.  Anti-inflammatory medicine.  Pain medicine for inflammatory conditions.  Antibiotics if an infection is present.  You may be advised to change lifestyle habits. This includes stopping smoking and avoiding alcohol, caffeine, and chocolate.  You may be advised to keep your head raised (elevated) when sleeping. This reduces the chance of acid going backward from your stomach into your esophagus.  Most of the time, nonspecific chest pain will improve within 2 to 3 days with rest and mild pain medicine. HOME CARE INSTRUCTIONS   If antibiotics were prescribed, take your antibiotics as directed. Finish them even if you start to feel better.  For the next few days, avoid physical  activities that bring on chest pain. Continue physical activities as directed.  Do not smoke.  Avoid drinking alcohol.  Only take over-the-counter or prescription medicine for pain, discomfort, or fever as directed by your caregiver.  Follow your caregiver's suggestions for further testing if your chest pain does not go away.  Keep any follow-up appointments you made. If you do not go to an appointment, you could develop lasting (chronic) problems with pain. If there is any problem keeping an appointment, you must call to reschedule. SEEK MEDICAL CARE IF:   You think you are having problems from the medicine you are taking. Read your medicine instructions carefully.  Your chest pain does not go away, even after treatment.  You develop a rash with blisters on your chest. SEEK IMMEDIATE MEDICAL CARE IF:   You have increased chest pain or pain that spreads to your arm, neck, jaw, back, or abdomen.  You develop shortness of breath, an increasing cough, or you are coughing up blood.  You have severe back or abdominal pain, feel nauseous, or vomit.  You develop severe weakness, fainting, or chills.  You have a fever. THIS IS AN EMERGENCY. Do not wait to see if the pain will go away. Get medical help at once. Call your local emergency services (911 in [place] Do not drive yourself to the hospital. MAKE SURE YOU:   Understand these instructions.  Will watch your condition.  Will get help right away if you are not doing well or get worse. Document Released: 09/20/2005 Document Revised: 03/04/2012 Document Reviewed: 07/16/2008 [employer] Patient Information 2013 [employer].  

## 2013-05-02 NOTE — Progress Notes (Signed)
Subjective:    Patient ID: Lindsey Webster, female    DOB: 1991-11-14, 22 y.o.   MRN: [id number]  Chest Pain  This is a recurrent problem. The current episode started more than 1 year ago. The onset quality is gradual. The problem occurs intermittently. The problem has been unchanged. The pain is present in the lateral region. The pain is at a severity of 2/10. The pain is mild. The quality of the pain is described as sharp. The pain does not radiate. Pertinent negatives include no abdominal pain, back pain, claudication, cough, diaphoresis, dizziness, exertional chest pressure, fever, leg pain, lower extremity edema, malaise/fatigue, nausea, near-syncope, numbness, palpitations, shortness of breath, sputum production, syncope, vomiting or weakness. The pain is aggravated by emotional upset. She has tried nothing for the symptoms.      Review of Systems  Constitutional: Negative.  Negative for fever, malaise/fatigue and diaphoresis.  HENT: Negative.   Eyes: Negative.   Respiratory: Negative for apnea, cough, sputum production, choking, chest tightness, shortness of breath, wheezing and stridor.   Cardiovascular: Positive for chest pain. Negative for palpitations, claudication, leg swelling, syncope and near-syncope.  Gastrointestinal: Negative.  Negative for nausea, vomiting, abdominal pain, diarrhea and constipation.  Endocrine: Negative.   Genitourinary: Negative.   Musculoskeletal: Negative.  Negative for back pain.  Skin: Negative.   Allergic/Immunologic: Negative.   Neurological: Negative.  Negative for dizziness, weakness and numbness.  Hematological: Negative.   Psychiatric/Behavioral: Negative.        Objective:   Physical Exam  Vitals reviewed. Constitutional: She is oriented to person, place, and time. She appears well-developed and well-nourished.  Non-toxic appearance. She does not have a sickly appearance. She does not appear ill. No distress.  HENT:  Head: Normocephalic  and atraumatic.  Mouth/Throat: Oropharynx is clear and moist. No oropharyngeal exudate.  Eyes: Conjunctivae are normal. Right eye exhibits no discharge. Left eye exhibits no discharge. No scleral icterus.  Neck: Normal range of motion. Neck supple. No JVD present. No tracheal deviation present. No thyromegaly present.  Cardiovascular: Normal rate, regular rhythm, S1 normal, S2 normal, normal heart sounds and intact distal pulses.   No murmur heard. Pulses:      Carotid pulses are 1+ on the right side, and 1+ on the left side.      Radial pulses are 1+ on the right side, and 1+ on the left side.       Femoral pulses are 1+ on the right side, and 1+ on the left side.      Popliteal pulses are 1+ on the right side, and 1+ on the left side.       Dorsalis pedis pulses are 1+ on the right side, and 1+ on the left side.       Posterior tibial pulses are 1+ on the right side, and 1+ on the left side.  Pulmonary/Chest: Effort normal and breath sounds normal. No stridor. No respiratory distress. She has no wheezes. She has no rales. Chest wall is not dull to percussion. She exhibits no mass, no tenderness, no bony tenderness, no laceration, no crepitus, no edema, no deformity, no swelling and no retraction.  Abdominal: Soft. Bowel sounds are normal. She exhibits no distension and no mass. There is no tenderness. There is no rebound and no guarding.  Musculoskeletal: Normal range of motion. She exhibits no edema and no tenderness.  Lymphadenopathy:    She has no cervical adenopathy.  Neurological: She is oriented to person, place, and time.  Skin:  Skin is warm and dry. No rash noted. She is not diaphoretic. No erythema. No pallor.  Psychiatric: She has a normal mood and affect. Her behavior is normal. Judgment and thought content normal.     Lab Results  Component Value Date   WBC 13.5* 01/21/2013   HGB 11.1* 01/21/2013   HCT 37.0* 01/21/2013   PLT 205.0 03/24/2011   GLUCOSE 94 03/24/2011   ALT 12  03/24/2011   AST 16 03/24/2011   NA 140 03/24/2011   K 3.9 03/24/2011   CL 106 03/24/2011   CREATININE 0.7 03/24/2011   BUN 8 03/24/2011   CO2 29 03/24/2011       Assessment & Plan:

## 2013-05-04 ENCOUNTER — Encounter: Payer: Self-pay | Admitting: Internal Medicine

## 2013-05-04 NOTE — Assessment & Plan Note (Signed)
EKG was normal a week ago Her pain does not sound lung or cardiac All of her labs are normal today I think she has MS pain so I have asked her to try some nsaids

## 2013-05-04 NOTE — Assessment & Plan Note (Signed)
Restart nsaids in the event she is having a recurrence today Her ESR is normal so I don't think she has CT disease

## 2013-05-04 NOTE — Assessment & Plan Note (Signed)
I will recheck her CBC and her iron level today 

## 2013-05-21 ENCOUNTER — Other Ambulatory Visit (HOSPITAL_COMMUNITY)
Admission: RE | Admit: 2013-05-21 | Discharge: 2013-05-21 | Disposition: A | Discharge status: Home / Self Care | Payer: Managed Care, Other (non HMO) | Source: Ambulatory Visit | Attending: Internal Medicine | Admitting: Internal Medicine

## 2013-05-21 ENCOUNTER — Ambulatory Visit (INDEPENDENT_AMBULATORY_CARE_PROVIDER_SITE_OTHER): Payer: Managed Care, Other (non HMO) | Admitting: Internal Medicine

## 2013-05-21 ENCOUNTER — Other Ambulatory Visit (INDEPENDENT_AMBULATORY_CARE_PROVIDER_SITE_OTHER): Payer: Managed Care, Other (non HMO)

## 2013-05-21 ENCOUNTER — Encounter: Payer: Self-pay | Admitting: Internal Medicine

## 2013-05-21 VITALS — BP 110/70 | HR 66 | Temp 98.5°F | Resp 16 | Wt 130.0 lb

## 2013-05-21 DIAGNOSIS — Z0000 Encounter for general adult medical examination without abnormal findings: Secondary | ICD-10-CM

## 2013-05-21 DIAGNOSIS — Z113 Encounter for screening for infections with a predominantly sexual mode of transmission: Secondary | ICD-10-CM | POA: Insufficient documentation

## 2013-05-21 DIAGNOSIS — Z01419 Encounter for gynecological examination (general) (routine) without abnormal findings: Secondary | ICD-10-CM | POA: Insufficient documentation

## 2013-05-21 DIAGNOSIS — Z124 Encounter for screening for malignant neoplasm of cervix: Secondary | ICD-10-CM

## 2013-05-21 LAB — LIPID PANEL
Cholesterol: 148 mg/dL (ref 0–200)
Total CHOL/HDL Ratio: 3
Triglycerides: 25 mg/dL (ref 0.0–149.0)

## 2013-05-21 LAB — TSH: TSH: 0.73 u[IU]/mL (ref 0.35–5.50)

## 2013-05-21 NOTE — Progress Notes (Signed)
  Subjective:    Patient ID: Lindsey Webster, female    DOB: 1991/02/06, 22 y.o.   MRN: [id number]  HPI  She returns for a physical, she feels well an offers no complaints.   Review of Systems  All other systems reviewed and are negative.       Objective:   Physical Exam  Vitals reviewed. Constitutional: She is oriented to person, place, and time. She appears well-developed and well-nourished. No distress.  HENT:  Head: Normocephalic and atraumatic.  Mouth/Throat: Oropharynx is clear and moist. No oropharyngeal exudate.  Eyes: Conjunctivae are normal. Right eye exhibits no discharge. Left eye exhibits no discharge. No scleral icterus.  Neck: Normal range of motion. Neck supple. No JVD present. No tracheal deviation present. No thyromegaly present.  Cardiovascular: Normal rate, regular rhythm, normal heart sounds and intact distal pulses.  Exam reveals no gallop and no friction rub.   No murmur heard. Pulmonary/Chest: Effort normal and breath sounds normal. No stridor. No respiratory distress. She has no wheezes. She has no rales. She exhibits no tenderness.  Abdominal: Soft. Bowel sounds are normal. She exhibits no distension and no mass. There is no tenderness. There is no rebound and no guarding. Hernia confirmed negative in the right inguinal area and confirmed negative in the left inguinal area.  Genitourinary: Vagina normal. No breast swelling, tenderness, discharge or bleeding. Pelvic exam was performed with patient supine. No labial fusion. There is no rash, tenderness, lesion or injury on the right labia. There is no rash, lesion or injury on the left labia. No erythema, tenderness or bleeding around the vagina. No foreign body around the vagina. No signs of injury around the vagina. No vaginal discharge found.  Musculoskeletal: Normal range of motion. She exhibits no edema and no tenderness.  Lymphadenopathy:    She has no cervical adenopathy.       Right: No inguinal adenopathy  present.       Left: No inguinal adenopathy present.  Neurological: She is oriented to person, place, and time.  Skin: Skin is warm and dry. No rash noted. She is not diaphoretic. No erythema. No pallor.  Psychiatric: She has a normal mood and affect. Her behavior is normal. Judgment and thought content normal.     Lab Results  Component Value Date   WBC 5.1 05/02/2013   HGB 12.3 05/02/2013   HCT 39.0 05/02/2013   PLT 222.0 05/02/2013   GLUCOSE 94 03/24/2011   ALT 12 03/24/2011   AST 16 03/24/2011   NA 140 03/24/2011   K 3.9 03/24/2011   CL 106 03/24/2011   CREATININE 0.7 03/24/2011   BUN 8 03/24/2011   CO2 29 03/24/2011       Assessment & Plan:

## 2013-05-21 NOTE — Patient Instructions (Signed)
Preventive Care for Adults, Female A healthy lifestyle and preventive care can promote health and wellness. Preventive health guidelines for women include the following key practices.  A routine yearly physical is a good way to check with your caregiver about your health and preventive screening. It is a chance to share any concerns and updates on your health, and to receive a thorough exam.  Visit your dentist for a routine exam and preventive care every 6 months. Brush your teeth twice a day and floss once a day. Good oral hygiene prevents tooth decay and gum disease.  The frequency of eye exams is based on your age, health, family medical history, use of contact lenses, and other factors. Follow your caregiver's recommendations for frequency of eye exams.  Eat a healthy diet. Foods like vegetables, fruits, whole grains, low-fat dairy products, and lean protein foods contain the nutrients you need without too many calories. Decrease your intake of foods high in solid fats, added sugars, and salt. Eat the right amount of calories for you.Get information about a proper diet from your caregiver, if necessary.  Regular physical exercise is one of the most important things you can do for your health. Most adults should get at least 150 minutes of moderate-intensity exercise (any activity that increases your heart rate and causes you to sweat) each week. In addition, most adults need muscle-strengthening exercises on 2 or more days a week.  Maintain a healthy weight. The body mass index (BMI) is a screening tool to identify possible weight problems. It provides an estimate of body fat based on height and weight. Your caregiver can help determine your BMI, and can help you achieve or maintain a healthy weight.For adults 20 years and older:  A BMI below 18.5 is considered underweight.  A BMI of 18.5 to 24.9 is normal.  A BMI of 25 to 29.9 is considered overweight.  A BMI of 30 and above is  considered obese.  Maintain normal blood lipids and cholesterol levels by exercising and minimizing your intake of saturated fat. Eat a balanced diet with plenty of fruit and vegetables. Blood tests for lipids and cholesterol should begin at age 20 and be repeated every 5 years. If your lipid or cholesterol levels are high, you are over 50, or you are at high risk for heart disease, you may need your cholesterol levels checked more frequently.Ongoing high lipid and cholesterol levels should be treated with medicines if diet and exercise are not effective.  If you smoke, find out from your caregiver how to quit. If you do not use tobacco, do not start.  If you are pregnant, do not drink alcohol. If you are breastfeeding, be very cautious about drinking alcohol. If you are not pregnant and choose to drink alcohol, do not exceed 1 drink per day. One drink is considered to be 12 ounces (355 mL) of beer, 5 ounces (148 mL) of wine, or 1.5 ounces (44 mL) of liquor.  Avoid use of street drugs. Do not share needles with anyone. Ask for help if you need support or instructions about stopping the use of drugs.  High blood pressure causes heart disease and increases the risk of stroke. Your blood pressure should be checked at least every 1 to 2 years. Ongoing high blood pressure should be treated with medicines if weight loss and exercise are not effective.  If you are 55 to 24 years old, ask your caregiver if you should take aspirin to prevent strokes.  Diabetes   screening involves taking a blood sample to check your fasting blood sugar level. This should be done once every 3 years, after age 45, if you are within normal weight and without risk factors for diabetes. Testing should be considered at a younger age or be carried out more frequently if you are overweight and have at least 1 risk factor for diabetes.  Breast cancer screening is essential preventive care for women. You should practice "breast  self-awareness." This means understanding the normal appearance and feel of your breasts and may include breast self-examination. Any changes detected, no matter how small, should be reported to a caregiver. Women in their 20s and 30s should have a clinical breast exam (CBE) by a caregiver as part of a regular health exam every 1 to 3 years. After age 40, women should have a CBE every year. Starting at age 40, women should consider having a mammography (breast X-ray test) every year. Women who have a family history of breast cancer should talk to their caregiver about genetic screening. Women at a high risk of breast cancer should talk to their caregivers about having magnetic resonance imaging (MRI) and a mammography every year.  The Pap test is a screening test for cervical cancer. A Pap test can show cell changes on the cervix that might become cervical cancer if left untreated. A Pap test is a procedure in which cells are obtained and examined from the lower end of the uterus (cervix).  Women should have a Pap test starting at age 21.  Between ages 21 and 29, Pap tests should be repeated every 2 years.  Beginning at age 30, you should have a Pap test every 3 years as long as the past 3 Pap tests have been normal.  Some women have medical problems that increase the chance of getting cervical cancer. Talk to your caregiver about these problems. It is especially important to talk to your caregiver if a new problem develops soon after your last Pap test. In these cases, your caregiver may recommend more frequent screening and Pap tests.  The above recommendations are the same for women who have or have not gotten the vaccine for human papillomavirus (HPV).  If you had a hysterectomy for a problem that was not cancer or a condition that could lead to cancer, then you no longer need Pap tests. Even if you no longer need a Pap test, a regular exam is a good idea to make sure no other problems are  starting.  If you are between ages 65 and 70, and you have had normal Pap tests going back 10 years, you no longer need Pap tests. Even if you no longer need a Pap test, a regular exam is a good idea to make sure no other problems are starting.  If you have had past treatment for cervical cancer or a condition that could lead to cancer, you need Pap tests and screening for cancer for at least 20 years after your treatment.  If Pap tests have been discontinued, risk factors (such as a new sexual partner) need to be reassessed to determine if screening should be resumed.  The HPV test is an additional test that may be used for cervical cancer screening. The HPV test looks for the virus that can cause the cell changes on the cervix. The cells collected during the Pap test can be tested for HPV. The HPV test could be used to screen women aged 30 years and older, and should   be used in women of any age who have unclear Pap test results. After the age of 30, women should have HPV testing at the same frequency as a Pap test.  Colorectal cancer can be detected and often prevented. Most routine colorectal cancer screening begins at the age of 50 and continues through age 75. However, your caregiver may recommend screening at an earlier age if you have risk factors for colon cancer. On a yearly basis, your caregiver may provide home test kits to check for hidden blood in the stool. Use of a small camera at the end of a tube, to directly examine the colon (sigmoidoscopy or colonoscopy), can detect the earliest forms of colorectal cancer. Talk to your caregiver about this at age 50, when routine screening begins. Direct examination of the colon should be repeated every 5 to 10 years through age 75, unless early forms of pre-cancerous polyps or small growths are found.  Hepatitis C blood testing is recommended for all people born from 1945 through 1965 and any individual with known risks for hepatitis C.  Practice  safe sex. Use condoms and avoid high-risk sexual practices to reduce the spread of sexually transmitted infections (STIs). STIs include gonorrhea, chlamydia, syphilis, trichomonas, herpes, HPV, and human immunodeficiency virus (HIV). Herpes, HIV, and HPV are viral illnesses that have no cure. They can result in disability, cancer, and death. Sexually active women aged 25 and younger should be checked for chlamydia. Older women with new or multiple partners should also be tested for chlamydia. Testing for other STIs is recommended if you are sexually active and at increased risk.  Osteoporosis is a disease in which the bones lose minerals and strength with aging. This can result in serious bone fractures. The risk of osteoporosis can be identified using a bone density scan. Women ages 65 and over and women at risk for fractures or osteoporosis should discuss screening with their caregivers. Ask your caregiver whether you should take a calcium supplement or vitamin D to reduce the rate of osteoporosis.  Menopause can be associated with physical symptoms and risks. Hormone replacement therapy is available to decrease symptoms and risks. You should talk to your caregiver about whether hormone replacement therapy is right for you.  Use sunscreen with sun protection factor (SPF) of 30 or more. Apply sunscreen liberally and repeatedly throughout the day. You should seek shade when your shadow is shorter than you. Protect yourself by wearing long sleeves, pants, a wide-brimmed hat, and sunglasses year round, whenever you are outdoors.  Once a month, do a whole body skin exam, using a mirror to look at the skin on your back. Notify your caregiver of new moles, moles that have irregular borders, moles that are larger than a pencil eraser, or moles that have changed in shape or color.  Stay current with required immunizations.  Influenza. You need a dose every fall (or winter). The composition of the flu vaccine  changes each year, so being vaccinated once is not enough.  Pneumococcal polysaccharide. You need 1 to 2 doses if you smoke cigarettes or if you have certain chronic medical conditions. You need 1 dose at age 65 (or older) if you have never been vaccinated.  Tetanus, diphtheria, pertussis (Tdap, Td). Get 1 dose of Tdap vaccine if you are younger than age 65, are over 65 and have contact with an infant, are a healthcare worker, are pregnant, or simply want to be protected from whooping cough. After that, you need a Td   booster dose every 10 years. Consult your caregiver if you have not had at least 3 tetanus and diphtheria-containing shots sometime in your life or have a deep or dirty wound.  HPV. You need this vaccine if you are a woman age 26 or younger. The vaccine is given in 3 doses over 6 months.  Measles, mumps, rubella (MMR). You need at least 1 dose of MMR if you were born in 1957 or later. You may also need a second dose.  Meningococcal. If you are age 19 to 21 and a first-year college student living in a residence hall, or have one of several medical conditions, you need to get vaccinated against meningococcal disease. You may also need additional booster doses.  Zoster (shingles). If you are age 60 or older, you should get this vaccine.  Varicella (chickenpox). If you have never had chickenpox or you were vaccinated but received only 1 dose, talk to your caregiver to find out if you need this vaccine.  Hepatitis A. You need this vaccine if you have a specific risk factor for hepatitis A virus infection or you simply wish to be protected from this disease. The vaccine is usually given as 2 doses, 6 to 18 months apart.  Hepatitis B. You need this vaccine if you have a specific risk factor for hepatitis B virus infection or you simply wish to be protected from this disease. The vaccine is given in 3 doses, usually over 6 months. Preventive Services / Frequency Ages 19 to 39  Blood  pressure check.** / Every 1 to 2 years.  Lipid and cholesterol check.** / Every 5 years beginning at age 20.  Clinical breast exam.** / Every 3 years for women in their 20s and 30s.  Pap test.** / Every 2 years from ages 21 through 29. Every 3 years starting at age 30 through age 65 or 70 with a history of 3 consecutive normal Pap tests.  HPV screening.** / Every 3 years from ages 30 through ages 65 to 70 with a history of 3 consecutive normal Pap tests.  Hepatitis C blood test.** / For any individual with known risks for hepatitis C.  Skin self-exam. / Monthly.  Influenza immunization.** / Every year.  Pneumococcal polysaccharide immunization.** / 1 to 2 doses if you smoke cigarettes or if you have certain chronic medical conditions.  Tetanus, diphtheria, pertussis (Tdap, Td) immunization. / A one-time dose of Tdap vaccine. After that, you need a Td booster dose every 10 years.  HPV immunization. / 3 doses over 6 months, if you are 26 and younger.  Measles, mumps, rubella (MMR) immunization. / You need at least 1 dose of MMR if you were born in 1957 or later. You may also need a second dose.  Meningococcal immunization. / 1 dose if you are age 19 to 21 and a first-year college student living in a residence hall, or have one of several medical conditions, you need to get vaccinated against meningococcal disease. You may also need additional booster doses.  Varicella immunization.** / Consult your caregiver.  Hepatitis A immunization.** / Consult your caregiver. 2 doses, 6 to 18 months apart.  Hepatitis B immunization.** / Consult your caregiver. 3 doses usually over 6 months. Ages 40 to 64  Blood pressure check.** / Every 1 to 2 years.  Lipid and cholesterol check.** / Every 5 years beginning at age 20.  Clinical breast exam.** / Every year after age 40.  Mammogram.** / Every year beginning at age 40   and continuing for as long as you are in good health. Consult with your  caregiver.  Pap test.** / Every 3 years starting at age 30 through age 65 or 70 with a history of 3 consecutive normal Pap tests.  HPV screening.** / Every 3 years from ages 30 through ages 65 to 70 with a history of 3 consecutive normal Pap tests.  Fecal occult blood test (FOBT) of stool. / Every year beginning at age 50 and continuing until age 75. You may not need to do this test if you get a colonoscopy every 10 years.  Flexible sigmoidoscopy or colonoscopy.** / Every 5 years for a flexible sigmoidoscopy or every 10 years for a colonoscopy beginning at age 50 and continuing until age 75.  Hepatitis C blood test.** / For all people born from 1945 through 1965 and any individual with known risks for hepatitis C.  Skin self-exam. / Monthly.  Influenza immunization.** / Every year.  Pneumococcal polysaccharide immunization.** / 1 to 2 doses if you smoke cigarettes or if you have certain chronic medical conditions.  Tetanus, diphtheria, pertussis (Tdap, Td) immunization.** / A one-time dose of Tdap vaccine. After that, you need a Td booster dose every 10 years.  Measles, mumps, rubella (MMR) immunization. / You need at least 1 dose of MMR if you were born in 1957 or later. You may also need a second dose.  Varicella immunization.** / Consult your caregiver.  Meningococcal immunization.** / Consult your caregiver.  Hepatitis A immunization.** / Consult your caregiver. 2 doses, 6 to 18 months apart.  Hepatitis B immunization.** / Consult your caregiver. 3 doses, usually over 6 months. Ages 65 and over  Blood pressure check.** / Every 1 to 2 years.  Lipid and cholesterol check.** / Every 5 years beginning at age 20.  Clinical breast exam.** / Every year after age 40.  Mammogram.** / Every year beginning at age 40 and continuing for as long as you are in good health. Consult with your caregiver.  Pap test.** / Every 3 years starting at age 30 through age 65 or 70 with a 3  consecutive normal Pap tests. Testing can be stopped between 65 and 70 with 3 consecutive normal Pap tests and no abnormal Pap or HPV tests in the past 10 years.  HPV screening.** / Every 3 years from ages 30 through ages 65 or 70 with a history of 3 consecutive normal Pap tests. Testing can be stopped between 65 and 70 with 3 consecutive normal Pap tests and no abnormal Pap or HPV tests in the past 10 years.  Fecal occult blood test (FOBT) of stool. / Every year beginning at age 50 and continuing until age 75. You may not need to do this test if you get a colonoscopy every 10 years.  Flexible sigmoidoscopy or colonoscopy.** / Every 5 years for a flexible sigmoidoscopy or every 10 years for a colonoscopy beginning at age 50 and continuing until age 75.  Hepatitis C blood test.** / For all people born from 1945 through 1965 and any individual with known risks for hepatitis C.  Osteoporosis screening.** / A one-time screening for women ages 65 and over and women at risk for fractures or osteoporosis.  Skin self-exam. / Monthly.  Influenza immunization.** / Every year.  Pneumococcal polysaccharide immunization.** / 1 dose at age 65 (or older) if you have never been vaccinated.  Tetanus, diphtheria, pertussis (Tdap, Td) immunization. / A one-time dose of Tdap vaccine if you are over   65 and have contact with an infant, are a healthcare worker, or simply want to be protected from whooping cough. After that, you need a Td booster dose every 10 years.  Varicella immunization.** / Consult your caregiver.  Meningococcal immunization.** / Consult your caregiver.  Hepatitis A immunization.** / Consult your caregiver. 2 doses, 6 to 18 months apart.  Hepatitis B immunization.** / Check with your caregiver. 3 doses, usually over 6 months. ** Family history and personal history of risk and conditions may change your caregiver's recommendations. Document Released: 02/06/2002 Document Revised: 03/04/2012  Document Reviewed: 05/08/2011 [employer] Patient Information 2014 [employer].  

## 2013-05-22 ENCOUNTER — Encounter: Payer: Self-pay | Admitting: Internal Medicine

## 2013-05-22 NOTE — Assessment & Plan Note (Signed)
Exam done Vaccines were reviewed PAP was sent Labs ordered Pt ed material was given

## 2013-05-26 ENCOUNTER — Encounter: Payer: Self-pay | Admitting: Internal Medicine

## 2013-10-09 ENCOUNTER — Emergency Department (HOSPITAL_COMMUNITY)
Admission: EM | Admit: 2013-10-09 | Discharge: 2013-10-09 | Disposition: A | Discharge status: Home / Self Care | Payer: Managed Care, Other (non HMO) | Attending: Emergency Medicine | Admitting: Emergency Medicine

## 2013-10-09 DIAGNOSIS — Z862 Personal history of diseases of the blood and blood-forming organs and certain disorders involving the immune mechanism: Secondary | ICD-10-CM | POA: Insufficient documentation

## 2013-10-09 DIAGNOSIS — J45909 Unspecified asthma, uncomplicated: Secondary | ICD-10-CM | POA: Insufficient documentation

## 2013-10-09 DIAGNOSIS — Z8679 Personal history of other diseases of the circulatory system: Secondary | ICD-10-CM | POA: Insufficient documentation

## 2013-10-09 DIAGNOSIS — H60399 Other infective otitis externa, unspecified ear: Secondary | ICD-10-CM | POA: Insufficient documentation

## 2013-10-09 DIAGNOSIS — H6002 Abscess of left external ear: Secondary | ICD-10-CM

## 2013-10-09 MED ORDER — SULFAMETHOXAZOLE-TRIMETHOPRIM 200-40 MG/5ML PO SUSP: 20.0000 mL | Freq: Two times a day (BID) | ORAL | Status: AC

## 2013-10-09 MED ORDER — HYDROCODONE-ACETAMINOPHEN 7.5-325 MG/15ML PO SOLN: 10.0000 mL | Freq: Four times a day (QID) | ORAL | Status: AC | PRN

## 2013-10-09 NOTE — ED Provider Notes (Signed)
CSN: [id number]     Arrival date & time 10/09/13  1555 History  This chart was scribed for non-physician practitioner,Avalee Castrellon Biagio Borg, working with Flint Melter, MD by Ashley Jacobs, ED scribe. This patient was seen in room WTR7/WTR7 and the patient's care was started at 4:51 PM.   First MD Initiated Contact with Patient 10/09/13 1627     Chief Complaint  Patient presents with  . Bump in Ear    (Consider location/radiation/quality/duration/timing/severity/associated sxs/prior Treatment) The history is provided by the patient and medical records. No language interpreter was used.   HPI Comments: Lindsey Webster is a 22 y.o. female who arrives to the Emergency Department complaining of raised bump on left ear that presented yesterday. Pain radiates throughout left side of head. Pt mentions having a painful sensation associated to the site. Pt's significant other reports she and the pt tried to drain the site without success. Pt is experiencing yellow drainage from the site and it is progressively worsening with swelling. Nothing seems to relieve her symptoms. Pt tried 800 mg of Tylenol PTA with no improvement. Denies fevers, inner ear pain, change in hearing, discharge from her ear.   Past Medical History  Diagnosis Date  . Asthma   . Anemia   . Migraine 2012   No past surgical history on file. Family History  Problem Relation Age of Onset  . Colon cancer Mother   . Arthritis Father   . Stroke Maternal Grandfather   . Heart disease Paternal Grandmother    History  Substance Use Topics  . Smoking status: Never Smoker   . Smokeless tobacco: Never Used  . Alcohol Use: 0.6 oz/week    1 Shots of liquor per week   OB History   Grav Para Term Preterm Abortions TAB SAB Ect Mult Living                 Review of Systems  Constitutional: Positive for chills.  HENT: Positive for ear pain.   Skin: Positive for wound.  Neurological: Positive for headaches.    Allergies  Review of  patient's allergies indicates no known allergies.  Home Medications   Current Outpatient Rx  Name  Route  Sig  Dispense  Refill  . ibuprofen (ADVIL,MOTRIN) 200 MG tablet   Oral   Take 200 mg by mouth every 6 (six) hours as needed for pain or headache.          BP 120/66  Pulse 69  Temp(Src) 98.6 F (37 C) (Oral)  Resp 16  SpO2 100% Physical Exam  Nursing note and vitals reviewed. Constitutional: She appears well-developed and well-nourished. No distress.  HENT:  Head: Normocephalic and atraumatic.  Left Ear: Tympanic membrane and ear canal normal.  Ears:     Neck: Neck supple.  Pulmonary/Chest: Effort normal.  Neurological: She is alert.  Skin: She is not diaphoretic.    ED Course  Procedures (including critical care time) DIAGNOSTIC STUDIES: Oxygen Saturation is 100% on room air, normal by my interpretation.    COORDINATION OF CARE: 4:55 PM Discussed course of care with pt which includes Hydrocodone and Bactrim. Pt understands and agrees.   Labs Review Labs Reviewed - No data to display Imaging Review No results found.  EKG Interpretation   None     5:17 INCISION AND DRAINAGE Performed by: Trixie Dredge, PA-C Consent: Verbal consent obtained. Risks and benefits: risks, benefits and alternatives were discussed Type: abscess  Body area: auricle of left ear  Anesthesia:  none  Incision was made with 18 gauge needle  Local anesthetic: none  Complexity: simple  Drainage: purulent  Drainage amount: moderate to large amount  Patient tolerance: Patient tolerated the procedure well with no immediate complications.  Pt requested liquid medication    MDM   1. Abscess of left external ear     Pt with small abscess within left external ear.  Pt declined local anesthesia and requested that I use a needle and not a scalpel to drain the area.  Discussed all risks and benefits of I&D.  Abscess drained with incision by 18 gauge needle with large amount  of pus expressed.  Pt d/c home with norco and bactrim, PCP follow up.  Discussed findings, treatment, and follow up  with patient.  Pt given return precautions.  Pt verbalizes understanding and agrees with plan.      I doubt any other EMC precluding discharge at this time including, but not necessarily limited to the following: deep space infection within the head or neck  I personally performed the services described in this documentation, which was scribed in my presence. The recorded information has been reviewed and is accurate.   Trixie Dredge, PA-C 10/09/13 1745

## 2013-10-09 NOTE — ED Notes (Signed)
Pt states she has a "huge lump" in her L ear. Pt has a raised papular bump to outer ear. Pt states area is very painful. Pt first noticed it yesterday.

## 2013-10-09 NOTE — ED Provider Notes (Signed)
Medical screening examination/treatment/procedure(s) were performed by non-physician practitioner and as supervising physician I was immediately available for consultation/collaboration.  Ryana Montecalvo L Terryn Redner, MD 10/09/13 2151 

## 2013-10-30 ENCOUNTER — Other Ambulatory Visit: Payer: Self-pay

## 2014-06-23 ENCOUNTER — Encounter: Payer: Managed Care, Other (non HMO) | Admitting: Internal Medicine

## 2014-06-23 DIAGNOSIS — Z0289 Encounter for other administrative examinations: Secondary | ICD-10-CM

## 2014-10-09 ENCOUNTER — Other Ambulatory Visit: Payer: Self-pay

## 2014-10-12 ENCOUNTER — Encounter: Payer: Managed Care, Other (non HMO) | Admitting: Internal Medicine

## 2014-10-12 DIAGNOSIS — Z0289 Encounter for other administrative examinations: Secondary | ICD-10-CM

## 2015-01-01 ENCOUNTER — Ambulatory Visit (INDEPENDENT_AMBULATORY_CARE_PROVIDER_SITE_OTHER): Payer: Managed Care, Other (non HMO) | Admitting: Internal Medicine

## 2015-01-01 ENCOUNTER — Encounter: Payer: Self-pay | Admitting: Internal Medicine

## 2015-01-01 ENCOUNTER — Ambulatory Visit (INDEPENDENT_AMBULATORY_CARE_PROVIDER_SITE_OTHER)
Admission: RE | Admit: 2015-01-01 | Discharge: 2015-01-01 | Disposition: A | Discharge status: Home / Self Care | Payer: Managed Care, Other (non HMO) | Source: Ambulatory Visit | Attending: Internal Medicine | Admitting: Internal Medicine

## 2015-01-01 VITALS — BP 110/74 | HR 62 | Temp 98.6°F | Resp 16 | Ht 62.0 in | Wt 148.0 lb

## 2015-01-01 DIAGNOSIS — R059 Cough, unspecified: Secondary | ICD-10-CM | POA: Insufficient documentation

## 2015-01-01 DIAGNOSIS — R05 Cough: Secondary | ICD-10-CM

## 2015-01-01 DIAGNOSIS — J202 Acute bronchitis due to streptococcus: Secondary | ICD-10-CM

## 2015-01-01 MED ORDER — AZITHROMYCIN 500 MG PO TABS: 500.0000 mg | ORAL_TABLET | Freq: Every day | ORAL | Status: DC

## 2015-01-01 MED ORDER — HYDROCODONE-HOMATROPINE 5-1.5 MG/5ML PO SYRP: 5.0000 mL | ORAL_SOLUTION | Freq: Three times a day (TID) | ORAL | Status: DC | PRN

## 2015-01-01 NOTE — Patient Instructions (Signed)
Cough, Adult  A cough is a reflex that helps clear your throat and airways. It can help heal the body or may be a reaction to an irritated airway. A cough may only last 2 or 3 weeks (acute) or may last more than 8 weeks (chronic).  CAUSES Acute cough:  Viral or bacterial infections. Chronic cough:  Infections.  Allergies.  Asthma.  Post-nasal drip.  Smoking.  Heartburn or acid reflux.  Some medicines.  Chronic lung problems (COPD).  Cancer. SYMPTOMS   Cough.  Fever.  Chest pain.  Increased breathing rate.  High-pitched whistling sound when breathing (wheezing).  Colored mucus that you cough up (sputum). TREATMENT   A bacterial cough may be treated with antibiotic medicine.  A viral cough must run its course and will not respond to antibiotics.  Your caregiver may recommend other treatments if you have a chronic cough. HOME CARE INSTRUCTIONS   Only take over-the-counter or prescription medicines for pain, discomfort, or fever as directed by your caregiver. Use cough suppressants only as directed by your caregiver.  Use a cold steam vaporizer or humidifier in your bedroom or home to help loosen secretions.  Sleep in a semi-upright position if your cough is worse at night.  Rest as needed.  Stop smoking if you smoke. SEEK IMMEDIATE MEDICAL CARE IF:   You have pus in your sputum.  Your cough starts to worsen.  You cannot control your cough with suppressants and are losing sleep.  You begin coughing up blood.  You have difficulty breathing.  You develop pain which is getting worse or is uncontrolled with medicine.  You have a fever. MAKE SURE YOU:   Understand these instructions.  Will watch your condition.  Will get help right away if you are not doing well or get worse. Document Released: 06/09/2011 Document Revised: 03/04/2012 Document Reviewed: 06/09/2011 [employer] Patient Information 2015 [employer]. This information is not intended  to replace advice given to you by your health care provider. Make sure you discuss any questions you have with your health care provider.  

## 2015-01-01 NOTE — Assessment & Plan Note (Signed)
Will check her CXR for pna, mass, edema

## 2015-01-01 NOTE — Progress Notes (Signed)
   Subjective:    Patient ID: Lindsey Webster, female    DOB: 1991/07/26, 26 y.o.   MRN: [id number]  Cough This is a recurrent problem. The current episode started in the past 7 days. The problem has been gradually worsening. The problem occurs every few hours. The cough is productive of purulent sputum. Associated symptoms include chills, myalgias and a sore throat. Pertinent negatives include no chest pain, ear congestion, ear pain, fever, headaches, heartburn, hemoptysis, nasal congestion, postnasal drip, rash, rhinorrhea, shortness of breath, sweats, weight loss or wheezing. She has tried OTC cough suppressant for the symptoms. The treatment provided mild relief. There is no history of asthma, bronchiectasis, bronchitis, COPD, emphysema, environmental allergies or pneumonia.      Review of Systems  Constitutional: Positive for chills. Negative for fever, weight loss, diaphoresis and fatigue.  HENT: Positive for sore throat. Negative for ear pain, postnasal drip, rhinorrhea, trouble swallowing and voice change.   Eyes: Negative.   Respiratory: Positive for cough. Negative for apnea, hemoptysis, choking, chest tightness, shortness of breath, wheezing and stridor.   Cardiovascular: Negative.  Negative for chest pain, palpitations and leg swelling.  Gastrointestinal: Negative.  Negative for heartburn, nausea, vomiting, abdominal pain, diarrhea, constipation and blood in stool.  Endocrine: Negative.   Genitourinary: Negative.   Musculoskeletal: Positive for myalgias. Negative for back pain and arthralgias.  Skin: Negative.  Negative for rash.  Allergic/Immunologic: Negative.  Negative for environmental allergies.  Neurological: Negative.  Negative for headaches.  Hematological: Negative.  Negative for adenopathy. Does not bruise/bleed easily.  Psychiatric/Behavioral: Negative.        Objective:   Physical Exam  Constitutional: She is oriented to person, place, and time. She appears  well-developed and well-nourished.  Non-toxic appearance. She does not have a sickly appearance. She does not appear ill. No distress.  HENT:  Head: Normocephalic and atraumatic.  Mouth/Throat: Oropharynx is clear and moist. No oropharyngeal exudate.  Eyes: Conjunctivae are normal. Right eye exhibits no discharge. Left eye exhibits no discharge. No scleral icterus.  Neck: Normal range of motion. Neck supple. No JVD present. No tracheal deviation present. No thyromegaly present.  Cardiovascular: Normal rate, regular rhythm, normal heart sounds and intact distal pulses.  Exam reveals no gallop and no friction rub.   No murmur heard. Pulmonary/Chest: Effort normal and breath sounds normal. No respiratory distress. She has no decreased breath sounds. She has no wheezes. She has no rhonchi. She has no rales. She exhibits no tenderness.  Abdominal: Soft. Bowel sounds are normal. She exhibits no distension and no mass. There is no tenderness. There is no rebound and no guarding.  Musculoskeletal: Normal range of motion. She exhibits no edema or tenderness.  Lymphadenopathy:    She has no cervical adenopathy.  Neurological: She is oriented to person, place, and time.  Skin: Skin is warm and dry. No rash noted. She is not diaphoretic. No erythema. No pallor.  Vitals reviewed.         Assessment & Plan:

## 2015-01-01 NOTE — Assessment & Plan Note (Signed)
Will treat the infection with zithromax and will control the cough with hycodan 

## 2015-03-13 ENCOUNTER — Encounter (HOSPITAL_COMMUNITY): Payer: Self-pay | Admitting: Emergency Medicine

## 2015-03-13 ENCOUNTER — Emergency Department (HOSPITAL_COMMUNITY)
Admission: EM | Admit: 2015-03-13 | Discharge: 2015-03-13 | Disposition: A | Discharge status: Home / Self Care | Payer: Managed Care, Other (non HMO) | Attending: Emergency Medicine | Admitting: Emergency Medicine

## 2015-03-13 DIAGNOSIS — J4520 Mild intermittent asthma, uncomplicated: Secondary | ICD-10-CM | POA: Insufficient documentation

## 2015-03-13 DIAGNOSIS — Z8679 Personal history of other diseases of the circulatory system: Secondary | ICD-10-CM | POA: Insufficient documentation

## 2015-03-13 DIAGNOSIS — J45909 Unspecified asthma, uncomplicated: Secondary | ICD-10-CM | POA: Diagnosis present

## 2015-03-13 DIAGNOSIS — Z862 Personal history of diseases of the blood and blood-forming organs and certain disorders involving the immune mechanism: Secondary | ICD-10-CM | POA: Insufficient documentation

## 2015-03-13 MED ORDER — ALBUTEROL SULFATE HFA 108 (90 BASE) MCG/ACT IN AERS: 1.0000 | INHALATION_SPRAY | Freq: Four times a day (QID) | RESPIRATORY_TRACT | Status: DC | PRN

## 2015-03-13 MED ORDER — ALBUTEROL SULFATE HFA 108 (90 BASE) MCG/ACT IN AERS
2.0000 | INHALATION_SPRAY | Freq: Once | RESPIRATORY_TRACT | Status: AC
  Administered 2015-03-13: 2 via RESPIRATORY_TRACT
  Filled 2015-03-13: qty 6.7

## 2015-03-13 NOTE — ED Provider Notes (Signed)
CSN: [id number]     Arrival date & time 03/13/15  2218 History   First MD Initiated Contact with Patient 03/13/15 2237     This chart was scribed for non-physician practitioner, Emilia BeckKaitlyn Ketrick Matney PA-C working with Richardean Canalavid H Yao, MD by Arlan OrganAshley Leger, ED Scribe. This patient was seen in room WTR5/WTR5 and the patient's care was started at 11:10 PM.   Chief Complaint  Patient presents with  . Asthma   The history is provided by the patient. No language interpreter was used.    HPI Comments: Willette BraceBrandy Ledesma is a 24 y.o. female with a PMHx of asthma who presents to the Emergency Department complaining of constant, ongoing SOB with associated cough x 1 day. Pt states her chest feels very tight and its difficult for her to catch a complete breath. Pt believes she may be having an asthma attack that was triggered by a gas smell earlier today. Ms. Carolin CoyRoach has tried sitting in from of a humidifier prior to arrival which help improve symptoms. Does not have an at home rescue inhaler. Last asthma attack several years ago. No known allergies to medications.  Past Medical History  Diagnosis Date  . Asthma   . Anemia   . Migraine 2012   History reviewed. No pertinent past surgical history. Family History  Problem Relation Age of Onset  . Colon cancer Mother   . Arthritis Father   . Stroke Maternal Grandfather   . Heart disease Paternal Grandmother    History  Substance Use Topics  . Smoking status: Never Smoker   . Smokeless tobacco: Never Used  . Alcohol Use: 0.6 oz/week    1 Shots of liquor per week   OB History    No data available     Review of Systems  Constitutional: Negative for fever and chills.  Respiratory: Positive for cough and shortness of breath.   All other systems reviewed and are negative.     Allergies  Review of patient's allergies indicates no known allergies.  Home Medications   Prior to Admission medications   Medication Sig Start Date End Date Taking? Authorizing  Provider  azithromycin (ZITHROMAX) 500 MG tablet Take 1 tablet (500 mg total) by mouth daily. 01/01/15   Etta Grandchildhomas L Jones, MD  HYDROcodone-homatropine [facility](HYCODAN) 5-1.5 MG/5ML syrup Take 5 mLs by mouth every 8 (eight) hours as needed for cough. 01/01/15   Etta Grandchildhomas L Jones, MD  ibuprofen (ADVIL,MOTRIN) 200 MG tablet Take 200 mg by mouth every 6 (six) hours as needed for pain or headache.    Historical Provider, MD   Triage Vitals: BP 117/96 mmHg  Pulse 64  Temp(Src) 98.3 F (36.8 C) (Oral)  Resp 18  SpO2 100%  LMP 03/13/2015 (Exact Date)   Physical Exam  Constitutional: She is oriented to person, place, and time. She appears well-developed and well-nourished. No distress.  HENT:  Head: Normocephalic and atraumatic.  Eyes: Conjunctivae and EOM are normal.  Neck: Normal range of motion.  Cardiovascular: Normal rate and regular rhythm.  Exam reveals no gallop and no friction rub.   No murmur heard. Pulmonary/Chest: Effort normal and breath sounds normal. She has no wheezes. She has no rales. She exhibits no tenderness.  Abdominal: Soft. She exhibits no distension. There is no tenderness.  Musculoskeletal: Normal range of motion.  Neurological: She is alert and oriented to person, place, and time.  Speech is goal-oriented. Moves limbs without ataxia.   Skin: Skin is warm and dry.  Psychiatric: She has  a normal mood and affect.  Nursing note and vitals reviewed.   ED Course  Procedures (including critical care time)  DIAGNOSTIC STUDIES: Oxygen Saturation is 100% on RA, Normal by my interpretation.    COORDINATION OF CARE: 11:09 PM-Discussed treatment plan with pt at bedside and pt agreed to plan.     Labs Review Labs Reviewed - No data to display  Imaging Review No results found.   EKG Interpretation None      MDM   Final diagnoses:  Asthma, mild intermittent, uncomplicated    11:44 PM Patient's vitals within normal limits. She appears comfortable. No wheezing noted. Patient  will have albuterol inhaler. PERC negative.   I personally performed the services described in this documentation, which was scribed in my presence. The recorded information has been reviewed and is accurate.   Emilia Beck, PA-C 03/13/15 2344  Richardean Canal, MD 03/14/15 [phone]

## 2015-03-13 NOTE — ED Notes (Signed)
Pt c/o SOB and asthma attack triggered (she assumes) by the gas smell earlier today. No wheezes on auscultation. Reports sitting in front of humidifier before coming today and says that helped. No other c/c. Does not have home inhaler and says she has not "had an attack in years."

## 2015-03-13 NOTE — Discharge Instructions (Signed)
Use albuterol inhaler as needed for shortness of breath. Refer to attached documents for more information.

## 2015-03-16 ENCOUNTER — Ambulatory Visit: Payer: Managed Care, Other (non HMO) | Admitting: Internal Medicine

## 2015-03-16 DIAGNOSIS — R4689 Other symptoms and signs involving appearance and behavior: Secondary | ICD-10-CM | POA: Insufficient documentation

## 2015-06-15 ENCOUNTER — Encounter (HOSPITAL_COMMUNITY): Payer: Self-pay | Admitting: Emergency Medicine

## 2015-06-15 ENCOUNTER — Emergency Department (HOSPITAL_COMMUNITY)
Admission: EM | Admit: 2015-06-15 | Discharge: 2015-06-15 | Disposition: A | Discharge status: Home / Self Care | Payer: Worker's Compensation | Attending: Emergency Medicine | Admitting: Emergency Medicine

## 2015-06-15 ENCOUNTER — Emergency Department (HOSPITAL_COMMUNITY): Payer: Worker's Compensation

## 2015-06-15 DIAGNOSIS — S61012A Laceration without foreign body of left thumb without damage to nail, initial encounter: Secondary | ICD-10-CM | POA: Insufficient documentation

## 2015-06-15 DIAGNOSIS — Z8679 Personal history of other diseases of the circulatory system: Secondary | ICD-10-CM | POA: Insufficient documentation

## 2015-06-15 DIAGNOSIS — S6992XA Unspecified injury of left wrist, hand and finger(s), initial encounter: Secondary | ICD-10-CM

## 2015-06-15 DIAGNOSIS — Y9289 Other specified places as the place of occurrence of the external cause: Secondary | ICD-10-CM | POA: Diagnosis not present

## 2015-06-15 DIAGNOSIS — Z79899 Other long term (current) drug therapy: Secondary | ICD-10-CM | POA: Insufficient documentation

## 2015-06-15 DIAGNOSIS — Z862 Personal history of diseases of the blood and blood-forming organs and certain disorders involving the immune mechanism: Secondary | ICD-10-CM | POA: Diagnosis not present

## 2015-06-15 DIAGNOSIS — Y9389 Activity, other specified: Secondary | ICD-10-CM | POA: Diagnosis not present

## 2015-06-15 DIAGNOSIS — J45909 Unspecified asthma, uncomplicated: Secondary | ICD-10-CM | POA: Diagnosis not present

## 2015-06-15 DIAGNOSIS — Z792 Long term (current) use of antibiotics: Secondary | ICD-10-CM | POA: Insufficient documentation

## 2015-06-15 DIAGNOSIS — W208XXA Other cause of strike by thrown, projected or falling object, initial encounter: Secondary | ICD-10-CM | POA: Insufficient documentation

## 2015-06-15 DIAGNOSIS — S61218A Laceration without foreign body of other finger without damage to nail, initial encounter: Secondary | ICD-10-CM

## 2015-06-15 DIAGNOSIS — S61211A Laceration without foreign body of left index finger without damage to nail, initial encounter: Secondary | ICD-10-CM | POA: Diagnosis not present

## 2015-06-15 DIAGNOSIS — Y990 Civilian activity done for income or pay: Secondary | ICD-10-CM | POA: Diagnosis not present

## 2015-06-15 NOTE — ED Notes (Signed)
L/hand swollen at anterior base of thumb. Pt stated that a large box fell onto her l/hand while at work today. Stated that bleeding was quickly controlled and bandaid applied. Pt is concerned about swelling and pain.

## 2015-06-15 NOTE — ED Provider Notes (Signed)
CSN: [id number]     Arrival date & time 06/15/15  1819 History  This chart was scribed for non-physician practitioner, Jaynie Crumble, PA-C working with Richardean Canal, MD by Placido Sou, ED scribe. This patient was seen in room WTR8/WTR8 and the patient's care was started at 7:23 PM.    Chief Complaint  Patient presents with  . Hand Injury    box fell onto l/hand   The history is provided by the patient. No language interpreter was used.    HPI Comments: Lindsey Webster is a 24 y.o. female who presents to the Emergency Department complaining of constant, moderate, left hand pain with onset earlier today. She notes a box fell on her hand resulting in pain and multiple small lacerations with controlled bleeding. She notes receiving an updated tetanus vaccination earlier today while visiting another provider for these same symptoms. She further notes that the swelling has worsened and came to the emergency department for a second opinion. She denies being prescribed or taking any medications PTA. She denies any other associated symptoms.   Past Medical History  Diagnosis Date  . Asthma   . Anemia   . Migraine 2012   No past surgical history on file. Family History  Problem Relation Age of Onset  . Colon cancer Mother   . Arthritis Father   . Stroke Maternal Grandfather   . Heart disease Paternal Grandmother    History  Substance Use Topics  . Smoking status: Never Smoker   . Smokeless tobacco: Never Used  . Alcohol Use: 0.6 oz/week    1 Shots of liquor per week   OB History    No data available     Review of Systems  Musculoskeletal: Positive for myalgias, joint swelling and arthralgias.  Skin: Positive for wound. Negative for color change and pallor.      Allergies  Review of patient's allergies indicates no known allergies.  Home Medications   Prior to Admission medications   Medication Sig Start Date End Date Taking? Authorizing Provider  albuterol (PROVENTIL  HFA;VENTOLIN HFA) 108 (90 BASE) MCG/ACT inhaler Inhale 1-2 puffs into the lungs every 6 (six) hours as needed for wheezing or shortness of breath. 03/13/15   Kaitlyn Szekalski, PA-C  azithromycin (ZITHROMAX) 500 MG tablet Take 1 tablet (500 mg total) by mouth daily. 01/01/15   Etta Grandchild, MD  HYDROcodone-homatropine [facility]) 5-1.5 MG/5ML syrup Take 5 mLs by mouth every 8 (eight) hours as needed for cough. 01/01/15   Etta Grandchild, MD  ibuprofen (ADVIL,MOTRIN) 200 MG tablet Take 200 mg by mouth every 6 (six) hours as needed for pain or headache.    Historical Provider, MD   There were no vitals taken for this visit. Physical Exam  Constitutional: She is oriented to person, place, and time. She appears well-developed and well-nourished. No distress.  HENT:  Head: Normocephalic and atraumatic.  Mouth/Throat: Oropharynx is clear and moist.  Eyes: Conjunctivae and EOM are normal. Pupils are equal, round, and reactive to light.  Neck: Normal range of motion. Neck supple. No tracheal deviation present.  Cardiovascular: Normal rate.   Pulmonary/Chest: Breath sounds normal. No respiratory distress.  Musculoskeletal:  Laceration to the left thumb, with sterri strips intact, 1cm long. Small skin abrasion to the index finger, hemostatic. Mild swelling to the thenar eminence. ttp diffusely over entire hand and wrist. Pain with any rom at the wrist joint, full rom. Full rom of all fingers. Pt is able to make a fist.  Do thumbs up, touch thumb to all fingers.   Neurological: She is alert and oriented to person, place, and time.  Skin: Skin is warm and dry.  Psychiatric: She has a normal mood and affect. Her behavior is normal.  Nursing note and vitals reviewed.   ED Course  Procedures  DIAGNOSTIC STUDIES: Oxygen Saturation is 100% on RA, normal by my interpretation.    COORDINATION OF CARE: 7:28 PM Discussed treatment plan with pt at bedside including an ACE wrap for the affected hand and a  recommendation to follow the RICE method and pt agreed to plan.  Labs Review Labs Reviewed - No data to display  Imaging Review Dg Wrist Complete Left  06/15/2015   CLINICAL DATA:  Trauma to the hand today. Pain between the first and second metacarpals and anterior LEFT wrist. No prior injury.  EXAM: LEFT WRIST - COMPLETE 3+ VIEW; LEFT HAND - COMPLETE 3+ VIEW  COMPARISON:  None.  FINDINGS: Distal radius and ulna appear normal. 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 Scaphoid bone intact. Negative for fracture. Nonspecific cyst present in the lunate.  The alignment of bones of the hand is anatomic. No fracture of the hand. Soft tissues appear normal.  IMPRESSION: No acute abnormality.   Electronically Signed   By: Andreas Newport M.D.   On: 06/15/2015 19:20   Dg Hand Complete Left  06/15/2015   CLINICAL DATA:  Trauma to the hand today. Pain between the first and second metacarpals and anterior LEFT wrist. No prior injury.  EXAM: LEFT WRIST - COMPLETE 3+ VIEW; LEFT HAND - COMPLETE 3+ VIEW  COMPARISON:  None.  FINDINGS: Distal radius and ulna appear normal. 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 Scaphoid bone intact. Negative for fracture. Nonspecific cyst present in the lunate.  The alignment of bones of the hand is anatomic. No fracture of the hand. Soft tissues appear normal.  IMPRESSION: No acute abnormality.   Electronically Signed   By: Andreas Newport M.D.   On: 06/15/2015 19:20     EKG Interpretation None      MDM   Final diagnoses:  Hand injury, left, initial encounter  Laceration of thumb, left, initial encounter  Laceration of index finger, initial encounter    Pt with left hand injury after a box fell and she was trying to catch it. She has already been seen at [facility], has Steri-Strip placed on small laceration to the thumb, was x-rayed, and discharged home with outpatient follow-up.  Patient was not happy with her treatment there, states "they didn't address my swelling." X-rays already placed by triage nurse. They're back negative. She has full range of motion of all fingers, she is neurovascularly intact, her swelling is minimal. Applied Ace wrap to the hand. Advised to elevate, ice, ibuprofen for pain. Follow-up with primary care doctor.  Filed Vitals:   06/15/15 1833  BP: 114/67  Pulse: 79  Temp: 99.1 F (37.3 C)  Resp: 18  SpO2: 100%   I personally performed the services described in this documentation, which was scribed in my presence. The recorded information has been reviewed and is accurate.   Jaynie Crumble, PA-C 06/15/15 1952  Richardean Canal, MD 06/15/15 2203

## 2015-06-15 NOTE — Discharge Instructions (Signed)
Take ibuprofen or tylenol for pain. Ace wrap for swelling and support. Keep hand elevated. Ice several times a day. Bacitracin to lacerations. Home with outpatient follow up.   Wrist Pain Wrist injuries are frequent in adults and children. A sprain is an injury to the ligaments that hold your bones together. A strain is an injury to muscle or muscle cord-like structures (tendons) from stretching or pulling. Generally, when wrists are moderately tender to touch following a fall or injury, a break in the bone (fracture) may be present. Most wrist sprains or strains are better in 3 to 5 days, but complete healing may take several weeks. HOME CARE INSTRUCTIONS   Put ice on the injured area.  Put ice in a plastic bag.  Place a towel between your skin and the bag.  Leave the ice on for 15-20 minutes, 3-4 times a day, for the first 2 days, or as directed by your health care provider.  Keep your arm raised above the level of your heart whenever possible to reduce swelling and pain.  Rest the injured area for at least 48 hours or as directed by your health care provider.  If a splint or elastic bandage has been applied, use it for as long as directed by your health care provider or until seen by a health care provider for a follow-up exam.  Only take over-the-counter or prescription medicines for pain, discomfort, or fever as directed by your health care provider.  Keep all follow-up appointments. You may need to follow up with a specialist or have follow-up X-rays. Improvement in pain level is not a guarantee that you did not fracture a bone in your wrist. The only way to determine whether or not you have a broken bone is by X-ray. SEEK IMMEDIATE MEDICAL CARE IF:   Your fingers are swollen, very red, white, or cold and blue.  Your fingers are numb or tingling.  You have increasing pain.  You have difficulty moving your fingers. MAKE SURE YOU:   Understand these instructions.  Will watch  your condition.  Will get help right away if you are not doing well or get worse. Document Released: 09/20/2005 Document Revised: 12/16/2013 Document Reviewed: 02/01/2011 [facility] Patient Information 2015 [place], [place]. This information is not intended to replace advice given to you by your health care provider. Make sure you discuss any questions you have with your health care provider.

## 2015-08-18 ENCOUNTER — Telehealth: Payer: Self-pay | Admitting: Family Medicine

## 2015-08-18 NOTE — Telephone Encounter (Signed)
I have scheduled patient to come in 9/7 for a new patient appointment.  Patient is requesting to be worked in sooner if possible because her wrist is in pain.

## 2015-08-18 NOTE — Telephone Encounter (Signed)
Pt scheduled for 8.29.16.

## 2015-08-23 ENCOUNTER — Other Ambulatory Visit (INDEPENDENT_AMBULATORY_CARE_PROVIDER_SITE_OTHER): Payer: Managed Care, Other (non HMO)

## 2015-08-23 ENCOUNTER — Ambulatory Visit (INDEPENDENT_AMBULATORY_CARE_PROVIDER_SITE_OTHER): Payer: Managed Care, Other (non HMO) | Admitting: Family Medicine

## 2015-08-23 ENCOUNTER — Encounter: Payer: Self-pay | Admitting: Family Medicine

## 2015-08-23 VITALS — BP 106/72 | HR 76 | Ht 62.0 in | Wt 152.0 lb

## 2015-08-23 DIAGNOSIS — R2232 Localized swelling, mass and lump, left upper limb: Secondary | ICD-10-CM

## 2015-08-23 DIAGNOSIS — M25532 Pain in left wrist: Secondary | ICD-10-CM | POA: Diagnosis not present

## 2015-08-23 MED ORDER — CEPHALEXIN 500 MG PO CAPS: 500.0000 mg | ORAL_CAPSULE | Freq: Two times a day (BID) | ORAL | Status: DC

## 2015-08-23 NOTE — Patient Instructions (Signed)
Nice to meet you  Ice can help if red Keflex  twice daily Monitor for enlargement and call me if it does and we can maybe drain it.

## 2015-08-23 NOTE — Progress Notes (Signed)
Pre visit review using our clinic review tool, if applicable. No additional management support is needed unless otherwise documented below in the visit note. 

## 2015-08-23 NOTE — Progress Notes (Signed)
[facility] [address] [place], [place] [address] Phone: [phone] Subjective:    I'm seeing this patient by the request  of:  Sanda Linger, MD   CC: Left wrist pain  [id number] Lindsey Webster is a 24 y.o. female coming in with complaint of left wrist pain. Patient describes it as more of a dull, throbbing aching sensation. Patient was in an injury at work on June 1. Patient emergency department and did have x-rays. X-rays were reviewed by me and showed no bony abnormality. Patient had a couple lacerations healed. Patient states though that since then she's had this mass on the inside of her wrist. States that it seems to fluctuate in size. Symptoms get red and very irritated. Sometimes get inflamed. Eyes any weakness with it. Denies any numbness. Rates the severity of pain a 7 out of 10 when needed. Patient denies any nighttime awakening.  Past Medical History  Diagnosis Date  . Asthma   . Anemia   . Migraine 2012   No past surgical history on file. Social History  Substance Use Topics  . Smoking status: Never Smoker   . Smokeless tobacco: Never Used  . Alcohol Use: 0.6 oz/week    1 Shots of liquor per week   No Known Allergies Family History  Problem Relation Age of Onset  . Colon cancer Mother   . Arthritis Father   . Stroke Maternal Grandfather   . Heart disease Paternal Grandmother         Past medical history, social, surgical and family history all reviewed in electronic medical record.   Review of Systems: No headache, visual changes, nausea, vomiting, diarrhea, constipation, dizziness, abdominal pain, skin rash, fevers, chills, night sweats, weight loss, swollen lymph nodes, body aches, joint swelling, muscle aches, chest pain, shortness of breath, mood changes.   Objective Blood pressure 106/72, pulse 76, height 5\' 2"  (1.575 m), weight 152 lb (68.947 kg), SpO2 99 %.  General: No apparent distress alert and oriented x3 mood  and affect normal, dressed appropriately.  HEENT: Pupils equal, extraocular movements intact  Respiratory: Patient's speak in full sentences and does not appear short of breath  Cardiovascular: No lower extremity edema, non tender, no erythema  Skin: Warm dry intact with no signs of infection or rash on extremities or on axial skeleton.  Abdomen: Soft nontender  Neuro: Cranial nerves II through XII are intact, neurovascularly intact in all extremities with 2+ DTRs and 2+ pulses.  Lymph: No lymphadenopathy of posterior or anterior cervical chain or axillae bilaterally.  Gait normal with good balance and coordination.  MSK:  Non tender with full range of motion and good stability and symmetric strength and tone of shoulders, elbows, wrist, hip, knee and ankles bilaterally.  Wrist: Left Inspection reveals palmar aspect near the carpal bones patient does have an overlying soft tissue mass. Not seen on the contralateral side. Nontender on exam today. Freely movable. Seems to be more soft tissue related. ROM smooth and normal with good flexion and extension and ulnar/radial deviation that is symmetrical with opposite wrist. Palpation is normal over metacarpals, navicular, lunate, and TFCC; tendons without tenderness/ swelling No snuffbox tenderness. No tenderness over Canal of Guyon. Strength 5/5 in all directions without pain. Negative Finkelstein, tinel's and phalens. Negative Watson's test.   MSK US performed of: Left wrist This study was ordered, performed, and interpreted by Terrilee Files D.O.  Wrist: All extensor compartments visualized and tendons all normal in appearance without  fraying, tears, or sheath effusions. No effusion seen. TFCC intact. Scapholunate ligament intact. Carpal tunnel visualized and median nerve area normal,  flexor tendons all normal in appearance without fraying, tears, or sheath effusions. Patient does have overlying soft tissue inflammation versus possible  rupture of Synvisc. Sac is not appreciated. Power doppler signal normal.  IMPRESSION:  Questionable ganglion cyst recently ruptured versus soft tissue infection     Impression and Recommendations:     This case required medical decision making of moderate complexity.

## 2015-08-23 NOTE — Assessment & Plan Note (Signed)
Likely ruptured ganglion cyst. We discussed the possibility of viscous soft tissue infection. Patient was given anti-biotics to help. 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 We discussed possibly intersection syndrome that could also be contribute. We might discuss bracing as well as home exercises but I do not think that this is necessary at this time. Patient has full range of motion. Patient come back and see me again in 3-4 weeks for further evaluation and treatment if it does not resolve.

## 2015-09-01 ENCOUNTER — Ambulatory Visit: Payer: Managed Care, Other (non HMO) | Admitting: Family Medicine

## 2015-09-02 ENCOUNTER — Other Ambulatory Visit (INDEPENDENT_AMBULATORY_CARE_PROVIDER_SITE_OTHER): Payer: Managed Care, Other (non HMO)

## 2015-09-02 ENCOUNTER — Ambulatory Visit (INDEPENDENT_AMBULATORY_CARE_PROVIDER_SITE_OTHER): Payer: Managed Care, Other (non HMO) | Admitting: Internal Medicine

## 2015-09-02 ENCOUNTER — Other Ambulatory Visit (HOSPITAL_COMMUNITY)
Admission: RE | Admit: 2015-09-02 | Discharge: 2015-09-02 | Disposition: A | Discharge status: Home / Self Care | Payer: Managed Care, Other (non HMO) | Source: Ambulatory Visit | Attending: Internal Medicine | Admitting: Internal Medicine

## 2015-09-02 ENCOUNTER — Encounter: Payer: Self-pay | Admitting: Internal Medicine

## 2015-09-02 VITALS — BP 110/84 | HR 60 | Temp 98.3°F | Resp 16 | Ht 62.0 in | Wt 148.0 lb

## 2015-09-02 DIAGNOSIS — Z01411 Encounter for gynecological examination (general) (routine) with abnormal findings: Secondary | ICD-10-CM | POA: Insufficient documentation

## 2015-09-02 DIAGNOSIS — Z0000 Encounter for general adult medical examination without abnormal findings: Secondary | ICD-10-CM | POA: Diagnosis not present

## 2015-09-02 DIAGNOSIS — Z1151 Encounter for screening for human papillomavirus (HPV): Secondary | ICD-10-CM | POA: Insufficient documentation

## 2015-09-02 DIAGNOSIS — Z113 Encounter for screening for infections with a predominantly sexual mode of transmission: Secondary | ICD-10-CM | POA: Diagnosis present

## 2015-09-02 DIAGNOSIS — Z202 Contact with and (suspected) exposure to infections with a predominantly sexual mode of transmission: Secondary | ICD-10-CM

## 2015-09-02 DIAGNOSIS — Z124 Encounter for screening for malignant neoplasm of cervix: Secondary | ICD-10-CM

## 2015-09-02 LAB — LIPID PANEL
Cholesterol: 171 mg/dL (ref 0–200)
HDL: 46.7 mg/dL
LDL Cholesterol: 117 mg/dL — ABNORMAL HIGH (ref 0–99)
NonHDL: 124.22
Total CHOL/HDL Ratio: 4
Triglycerides: 38 mg/dL (ref 0.0–149.0)
VLDL: 7.6 mg/dL (ref 0.0–40.0)

## 2015-09-02 LAB — COMPREHENSIVE METABOLIC PANEL
ALBUMIN: 4.1 g/dL (ref 3.5–5.2)
ALK PHOS: 42 U/L (ref 39–117)
ALT: 12 U/L (ref 0–35)
AST: 15 U/L (ref 0–37)
BILIRUBIN TOTAL: 0.5 mg/dL (ref 0.2–1.2)
BUN: 11 mg/dL (ref 6–23)
CALCIUM: 9.1 mg/dL (ref 8.4–10.5)
CO2: 29 meq/L (ref 19–32)
Chloride: 106 mEq/L (ref 96–112)
Creatinine, Ser: 0.77 mg/dL (ref 0.40–1.20)
GFR: 116.57 mL/min (ref >60.00)
Glucose, Bld: 83 mg/dL (ref 70–99)
Potassium: 4 mEq/L (ref 3.5–5.1)
Sodium: 140 mEq/L (ref 135–145)
TOTAL PROTEIN: 7.3 g/dL (ref 6.0–8.3)

## 2015-09-02 LAB — CBC WITH DIFFERENTIAL/PLATELET
BASOS ABS: 0 10*3/uL (ref 0.0–0.1)
Basophils Relative: 0.4 % (ref 0.0–3.0)
EOS ABS: 0.1 10*3/uL (ref 0.0–0.7)
Eosinophils Relative: 1.2 % (ref 0.0–5.0)
HEMATOCRIT: 40.2 % (ref 36.0–46.0)
HEMOGLOBIN: 12.9 g/dL (ref 12.0–15.0)
LYMPHS PCT: 31.7 % (ref 12.0–46.0)
Lymphs Abs: 1.5 10*3/uL (ref 0.7–4.0)
MCHC: 32.1 g/dL (ref 30.0–36.0)
MCV: 75 fl — AB (ref 78.0–100.0)
MONOS PCT: 5 % (ref 3.0–12.0)
Monocytes Absolute: 0.2 10*3/uL (ref 0.1–1.0)
Neutro Abs: 2.9 10*3/uL (ref 1.4–7.7)
Neutrophils Relative %: 61.7 % (ref 43.0–77.0)
Platelets: 227 10*3/uL (ref 150.0–400.0)
RBC: 5.36 Mil/uL — AB (ref 3.87–5.11)
RDW: 15.9 % — ABNORMAL HIGH (ref 11.5–15.5)
WBC: 4.7 10*3/uL (ref 4.0–10.5)

## 2015-09-02 LAB — HIV ANTIBODY (ROUTINE TESTING W REFLEX): HIV 1&2 Ab, 4th Generation: NONREACTIVE

## 2015-09-02 LAB — HM PAP SMEAR: HM Pap smear: NORMAL

## 2015-09-02 LAB — TSH: TSH: 0.99 u[IU]/mL (ref 0.35–4.50)

## 2015-09-02 LAB — RPR

## 2015-09-02 NOTE — Progress Notes (Signed)
Pre visit review using our clinic review tool, if applicable. No additional management support is needed unless otherwise documented below in the visit note. 

## 2015-09-02 NOTE — Patient Instructions (Signed)
Preventive Care for Adults A healthy lifestyle and preventive care can promote health and wellness. Preventive health guidelines for women include the following key practices.  A routine yearly physical is a good way to check with your health care provider about your health and preventive screening. It is a chance to share any concerns and updates on your health and to receive a thorough exam.  Visit your dentist for a routine exam and preventive care every 6 months. Brush your teeth twice a day and floss once a day. Good oral hygiene prevents tooth decay and gum disease.  The frequency of eye exams is based on your age, health, family medical history, use of contact lenses, and other factors. Follow your health care provider's recommendations for frequency of eye exams.  Eat a healthy diet. Foods like vegetables, fruits, whole grains, low-fat dairy products, and lean protein foods contain the nutrients you need without too many calories. Decrease your intake of foods high in solid fats, added sugars, and salt. Eat the right amount of calories for you.Get information about a proper diet from your health care provider, if necessary.  Regular physical exercise is one of the most important things you can do for your health. Most adults should get at least 150 minutes of moderate-intensity exercise (any activity that increases your heart rate and causes you to sweat) each week. In addition, most adults need muscle-strengthening exercises on 2 or more days a week.  Maintain a healthy weight. The body mass index (BMI) is a screening tool to identify possible weight problems. It provides an estimate of body fat based on height and weight. Your health care provider can find your BMI and can help you achieve or maintain a healthy weight.For adults 20 years and older:  A BMI below 18.5 is considered underweight.  A BMI of 18.5 to 24.9 is normal.  A BMI of 25 to 29.9 is considered overweight.  A BMI of  30 and above is considered obese.  Maintain normal blood lipids and cholesterol levels by exercising and minimizing your intake of saturated fat. Eat a balanced diet with plenty of fruit and vegetables. Blood tests for lipids and cholesterol should begin at age 76 and be repeated every 5 years. If your lipid or cholesterol levels are high, you are over 50, or you are at high risk for heart disease, you may need your cholesterol levels checked more frequently.Ongoing high lipid and cholesterol levels should be treated with medicines if diet and exercise are not working.  If you smoke, find out from your health care provider how to quit. If you do not use tobacco, do not start.  Lung cancer screening is recommended for adults aged 22-80 years who are at high risk for developing lung cancer because of a history of smoking. A yearly low-dose CT scan of the lungs is recommended for people who have at least a 30-pack-year history of smoking and are a current smoker or have quit within the past 15 years. A pack year of smoking is smoking an average of 1 pack of cigarettes a day for 1 year (for example: 1 pack a day for 30 years or 2 packs a day for 15 years). Yearly screening should continue until the smoker has stopped smoking for at least 15 years. Yearly screening should be stopped for people who develop a health problem that would prevent them from having lung cancer treatment.  If you are pregnant, do not drink alcohol. If you are breastfeeding,  be very cautious about drinking alcohol. If you are not pregnant and choose to drink alcohol, do not have more than 1 drink per day. One drink is considered to be 12 ounces (355 mL) of beer, 5 ounces (148 mL) of wine, or 1.5 ounces (44 mL) of liquor.  Avoid use of street drugs. Do not share needles with anyone. Ask for help if you need support or instructions about stopping the use of drugs.  High blood pressure causes heart disease and increases the risk of  stroke. Your blood pressure should be checked at least every 1 to 2 years. Ongoing high blood pressure should be treated with medicines if weight loss and exercise do not work.  If you are 3-86 years old, ask your health care provider if you should take aspirin to prevent strokes.  Diabetes screening involves taking a blood sample to check your fasting blood sugar level. This should be done once every 3 years, after age 67, if you are within normal weight and without risk factors for diabetes. Testing should be considered at a younger age or be carried out more frequently if you are overweight and have at least 1 risk factor for diabetes.  Breast cancer screening is essential preventive care for women. You should practice "breast self-awareness." This means understanding the normal appearance and feel of your breasts and may include breast self-examination. Any changes detected, no matter how small, should be reported to a health care provider. Women in their 8s and 30s should have a clinical breast exam (CBE) by a health care provider as part of a regular health exam every 1 to 3 years. After age 70, women should have a CBE every year. Starting at age 25, women should consider having a mammogram (breast X-ray test) every year. Women who have a family history of breast cancer should talk to their health care provider about genetic screening. Women at a high risk of breast cancer should talk to their health care providers about having an MRI and a mammogram every year.  Breast cancer gene (BRCA)-related cancer risk assessment is recommended for women who have family members with BRCA-related cancers. BRCA-related cancers include breast, ovarian, tubal, and peritoneal cancers. Having family members with these cancers may be associated with an increased risk for harmful changes (mutations) in the breast cancer genes BRCA1 and BRCA2. Results of the assessment will determine the need for genetic counseling and  BRCA1 and BRCA2 testing.  Routine pelvic exams to screen for cancer are no longer recommended for nonpregnant women who are considered low risk for cancer of the pelvic organs (ovaries, uterus, and vagina) and who do not have symptoms. Ask your health care provider if a screening pelvic exam is right for you.  If you have had past treatment for cervical cancer or a condition that could lead to cancer, you need Pap tests and screening for cancer for at least 20 years after your treatment. If Pap tests have been discontinued, your risk factors (such as having a new sexual partner) need to be reassessed to determine if screening should be resumed. Some women have medical problems that increase the chance of getting cervical cancer. In these cases, your health care provider may recommend more frequent screening and Pap tests.  The HPV test is an additional test that may be used for cervical cancer screening. The HPV test looks for the virus that can cause the cell changes on the cervix. The cells collected during the Pap test can be  tested for HPV. The HPV test could be used to screen women aged 30 years and older, and should be used in women of any age who have unclear Pap test results. After the age of 30, women should have HPV testing at the same frequency as a Pap test.  Colorectal cancer can be detected and often prevented. Most routine colorectal cancer screening begins at the age of 50 years and continues through age 75 years. However, your health care provider may recommend screening at an earlier age if you have risk factors for colon cancer. On a yearly basis, your health care provider may provide home test kits to check for hidden blood in the stool. Use of a small camera at the end of a tube, to directly examine the colon (sigmoidoscopy or colonoscopy), can detect the earliest forms of colorectal cancer. Talk to your health care provider about this at age 50, when routine screening begins. Direct  exam of the colon should be repeated every 5-10 years through age 75 years, unless early forms of pre-cancerous polyps or small growths are found.  People who are at an increased risk for hepatitis B should be screened for this virus. You are considered at high risk for hepatitis B if:  You were born in a country where hepatitis B occurs often. Talk with your health care provider about which countries are considered high risk.  Your parents were born in a high-risk country and you have not received a shot to protect against hepatitis B (hepatitis B vaccine).  You have HIV or AIDS.  You use needles to inject street drugs.  You live with, or have sex with, someone who has hepatitis B.  You get hemodialysis treatment.  You take certain medicines for conditions like cancer, organ transplantation, and autoimmune conditions.  Hepatitis C blood testing is recommended for all people born from 1945 through 1965 and any individual with known risks for hepatitis C.  Practice safe sex. Use condoms and avoid high-risk sexual practices to reduce the spread of sexually transmitted infections (STIs). STIs include gonorrhea, chlamydia, syphilis, trichomonas, herpes, HPV, and human immunodeficiency virus (HIV). Herpes, HIV, and HPV are viral illnesses that have no cure. They can result in disability, cancer, and death.  You should be screened for sexually transmitted illnesses (STIs) including gonorrhea and chlamydia if:  You are sexually active and are younger than 24 years.  You are older than 24 years and your health care provider tells you that you are at risk for this type of infection.  Your sexual activity has changed since you were last screened and you are at an increased risk for chlamydia or gonorrhea. Ask your health care provider if you are at risk.  If you are at risk of being infected with HIV, it is recommended that you take a prescription medicine daily to prevent HIV infection. This is  called preexposure prophylaxis (PrEP). You are considered at risk if:  You are a heterosexual woman, are sexually active, and are at increased risk for HIV infection.  You take drugs by injection.  You are sexually active with a partner who has HIV.  Talk with your health care provider about whether you are at high risk of being infected with HIV. If you choose to begin PrEP, you should first be tested for HIV. You should then be tested every 3 months for as long as you are taking PrEP.  Osteoporosis is a disease in which the bones lose minerals and strength   with aging. This can result in serious bone fractures or breaks. The risk of osteoporosis can be identified using a bone density scan. Women ages 65 years and over and women at risk for fractures or osteoporosis should discuss screening with their health care providers. Ask your health care provider whether you should take a calcium supplement or vitamin D to reduce the rate of osteoporosis.  Menopause can be associated with physical symptoms and risks. Hormone replacement therapy is available to decrease symptoms and risks. You should talk to your health care provider about whether hormone replacement therapy is right for you.  Use sunscreen. Apply sunscreen liberally and repeatedly throughout the day. You should seek shade when your shadow is shorter than you. Protect yourself by wearing long sleeves, pants, a wide-brimmed hat, and sunglasses year round, whenever you are outdoors.  Once a month, do a whole body skin exam, using a mirror to look at the skin on your back. Tell your health care provider of new moles, moles that have irregular borders, moles that are larger than a pencil eraser, or moles that have changed in shape or color.  Stay current with required vaccines (immunizations).  Influenza vaccine. All adults should be immunized every year.  Tetanus, diphtheria, and acellular pertussis (Td, Tdap) vaccine. Pregnant women should  receive 1 dose of Tdap vaccine during each pregnancy. The dose should be obtained regardless of the length of time since the last dose. Immunization is preferred during the 27th-36th week of gestation. An adult who has not previously received Tdap or who does not know her vaccine status should receive 1 dose of Tdap. This initial dose should be followed by tetanus and diphtheria toxoids (Td) booster doses every 10 years. Adults with an unknown or incomplete history of completing a 3-dose immunization series with Td-containing vaccines should begin or complete a primary immunization series including a Tdap dose. Adults should receive a Td booster every 10 years.  Varicella vaccine. An adult without evidence of immunity to varicella should receive 2 doses or a second dose if she has previously received 1 dose. Pregnant females who do not have evidence of immunity should receive the first dose after pregnancy. This first dose should be obtained before leaving the health care facility. The second dose should be obtained 4-8 weeks after the first dose.  Human papillomavirus (HPV) vaccine. Females aged 13-26 years who have not received the vaccine previously should obtain the 3-dose series. The vaccine is not recommended for use in pregnant females. However, pregnancy testing is not needed before receiving a dose. If a female is found to be pregnant after receiving a dose, no treatment is needed. In that case, the remaining doses should be delayed until after the pregnancy. Immunization is recommended for any person with an immunocompromised condition through the age of 26 years if she did not get any or all doses earlier. During the 3-dose series, the second dose should be obtained 4-8 weeks after the first dose. The third dose should be obtained 24 weeks after the first dose and 16 weeks after the second dose.  Zoster vaccine. One dose is recommended for adults aged 60 years or older unless certain conditions are  present.  Measles, mumps, and rubella (MMR) vaccine. Adults born before 1957 generally are considered immune to measles and mumps. Adults born in 1957 or later should have 1 or more doses of MMR vaccine unless there is a contraindication to the vaccine or there is laboratory evidence of immunity to   each of the three diseases. A routine second dose of MMR vaccine should be obtained at least 28 days after the first dose for students attending postsecondary schools, health care workers, or international travelers. People who received inactivated measles vaccine or an unknown type of measles vaccine during 1963-1967 should receive 2 doses of MMR vaccine. People who received inactivated mumps vaccine or an unknown type of mumps vaccine before 1979 and are at high risk for mumps infection should consider immunization with 2 doses of MMR vaccine. For females of childbearing age, rubella immunity should be determined. If there is no evidence of immunity, females who are not pregnant should be vaccinated. If there is no evidence of immunity, females who are pregnant should delay immunization until after pregnancy. Unvaccinated health care workers born before 1957 who lack laboratory evidence of measles, mumps, or rubella immunity or laboratory confirmation of disease should consider measles and mumps immunization with 2 doses of MMR vaccine or rubella immunization with 1 dose of MMR vaccine.  Pneumococcal 13-valent conjugate (PCV13) vaccine. When indicated, a person who is uncertain of her immunization history and has no record of immunization should receive the PCV13 vaccine. An adult aged 19 years or older who has certain medical conditions and has not been previously immunized should receive 1 dose of PCV13 vaccine. This PCV13 should be followed with a dose of pneumococcal polysaccharide (PPSV23) vaccine. The PPSV23 vaccine dose should be obtained at least 8 weeks after the dose of PCV13 vaccine. An adult aged 19  years or older who has certain medical conditions and previously received 1 or more doses of PPSV23 vaccine should receive 1 dose of PCV13. The PCV13 vaccine dose should be obtained 1 or more years after the last PPSV23 vaccine dose.  Pneumococcal polysaccharide (PPSV23) vaccine. When PCV13 is also indicated, PCV13 should be obtained first. All adults aged 65 years and older should be immunized. An adult younger than age 65 years who has certain medical conditions should be immunized. Any person who resides in a nursing home or long-term care facility should be immunized. An adult smoker should be immunized. People with an immunocompromised condition and certain other conditions should receive both PCV13 and PPSV23 vaccines. People with human immunodeficiency virus (HIV) infection should be immunized as soon as possible after diagnosis. Immunization during chemotherapy or radiation therapy should be avoided. Routine use of PPSV23 vaccine is not recommended for American Indians, [place] Natives, or people younger than 65 years unless there are medical conditions that require PPSV23 vaccine. When indicated, people who have unknown immunization and have no record of immunization should receive PPSV23 vaccine. One-time revaccination 5 years after the first dose of PPSV23 is recommended for people aged 19-64 years who have chronic kidney failure, nephrotic syndrome, asplenia, or immunocompromised conditions. People who received 1-2 doses of PPSV23 before age 65 years should receive another dose of PPSV23 vaccine at age 65 years or later if at least 5 years have passed since the previous dose. Doses of PPSV23 are not needed for people immunized with PPSV23 at or after age 65 years.  Meningococcal vaccine. Adults with asplenia or persistent complement component deficiencies should receive 2 doses of quadrivalent meningococcal conjugate (MenACWY-D) vaccine. The doses should be obtained at least 2 months apart.  Microbiologists working with certain meningococcal bacteria, military recruits, people at risk during an outbreak, and people who travel to or live in countries with a high rate of meningitis should be immunized. A first-year college student up through age   21 years who is living in a residence hall should receive a dose if she did not receive a dose on or after her 16th birthday. Adults who have certain high-risk conditions should receive one or more doses of vaccine.  Hepatitis A vaccine. Adults who wish to be protected from this disease, have certain high-risk conditions, work with hepatitis A-infected animals, work in hepatitis A research labs, or travel to or work in countries with a high rate of hepatitis A should be immunized. Adults who were previously unvaccinated and who anticipate close contact with an international adoptee during the first 60 days after arrival in the [place] from a country with a high rate of hepatitis A should be immunized.  Hepatitis B vaccine. Adults who wish to be protected from this disease, have certain high-risk conditions, may be exposed to blood or other infectious body fluids, are household contacts or sex partners of hepatitis B positive people, are clients or workers in certain care facilities, or travel to or work in countries with a high rate of hepatitis B should be immunized.  Haemophilus influenzae type b (Hib) vaccine. A previously unvaccinated person with asplenia or sickle cell disease or having a scheduled splenectomy should receive 1 dose of Hib vaccine. Regardless of previous immunization, a recipient of a hematopoietic stem cell transplant should receive a 3-dose series 6-12 months after her successful transplant. Hib vaccine is not recommended for adults with HIV infection. Preventive Services / Frequency Ages 64 to 68 years  Blood pressure check.** / Every 1 to 2 years.  Lipid and cholesterol check.** / Every 5 years beginning at age  22.  Clinical breast exam.** / Every 3 years for women in their 88s and 53s.  BRCA-related cancer risk assessment.** / For women who have family members with a BRCA-related cancer (breast, ovarian, tubal, or peritoneal cancers).  Pap test.** / Every 2 years from ages 90 through 51. Every 3 years starting at age 21 through age 56 or 3 with a history of 3 consecutive normal Pap tests.  HPV screening.** / Every 3 years from ages 24 through ages 1 to 46 with a history of 3 consecutive normal Pap tests.  Hepatitis C blood test.** / For any individual with known risks for hepatitis C.  Skin self-exam. / Monthly.  Influenza vaccine. / Every year.  Tetanus, diphtheria, and acellular pertussis (Tdap, Td) vaccine.** / Consult your health care provider. Pregnant women should receive 1 dose of Tdap vaccine during each pregnancy. 1 dose of Td every 10 years.  Varicella vaccine.** / Consult your health care provider. Pregnant females who do not have evidence of immunity should receive the first dose after pregnancy.  HPV vaccine. / 3 doses over 6 months, if 72 and younger. The vaccine is not recommended for use in pregnant females. However, pregnancy testing is not needed before receiving a dose.  Measles, mumps, rubella (MMR) vaccine.** / You need at least 1 dose of MMR if you were born in 1957 or later. You may also need a 2nd dose. For females of childbearing age, rubella immunity should be determined. If there is no evidence of immunity, females who are not pregnant should be vaccinated. If there is no evidence of immunity, females who are pregnant should delay immunization until after pregnancy.  Pneumococcal 13-valent conjugate (PCV13) vaccine.** / Consult your health care provider.  Pneumococcal polysaccharide (PPSV23) vaccine.** / 1 to 2 doses if you smoke cigarettes or if you have certain conditions.  Meningococcal vaccine.** /  1 dose if you are age 19 to 21 years and a first-year college  student living in a residence hall, or have one of several medical conditions, you need to get vaccinated against meningococcal disease. You may also need additional booster doses.  Hepatitis A vaccine.** / Consult your health care provider.  Hepatitis B vaccine.** / Consult your health care provider.  Haemophilus influenzae type b (Hib) vaccine.** / Consult your health care provider. Ages 40 to 64 years  Blood pressure check.** / Every 1 to 2 years.  Lipid and cholesterol check.** / Every 5 years beginning at age 20 years.  Lung cancer screening. / Every year if you are aged 55-80 years and have a 30-pack-year history of smoking and currently smoke or have quit within the past 15 years. Yearly screening is stopped once you have quit smoking for at least 15 years or develop a health problem that would prevent you from having lung cancer treatment.  Clinical breast exam.** / Every year after age 40 years.  BRCA-related cancer risk assessment.** / For women who have family members with a BRCA-related cancer (breast, ovarian, tubal, or peritoneal cancers).  Mammogram.** / Every year beginning at age 40 years and continuing for as long as you are in good health. Consult with your health care provider.  Pap test.** / Every 3 years starting at age 30 years through age 65 or 70 years with a history of 3 consecutive normal Pap tests.  HPV screening.** / Every 3 years from ages 30 years through ages 65 to 70 years with a history of 3 consecutive normal Pap tests.  Fecal occult blood test (FOBT) of stool. / Every year beginning at age 50 years and continuing until age 75 years. You may not need to do this test if you get a colonoscopy every 10 years.  Flexible sigmoidoscopy or colonoscopy.** / Every 5 years for a flexible sigmoidoscopy or every 10 years for a colonoscopy beginning at age 50 years and continuing until age 75 years.  Hepatitis C blood test.** / For all people born from 1945 through  1965 and any individual with known risks for hepatitis C.  Skin self-exam. / Monthly.  Influenza vaccine. / Every year.  Tetanus, diphtheria, and acellular pertussis (Tdap/Td) vaccine.** / Consult your health care provider. Pregnant women should receive 1 dose of Tdap vaccine during each pregnancy. 1 dose of Td every 10 years.  Varicella vaccine.** / Consult your health care provider. Pregnant females who do not have evidence of immunity should receive the first dose after pregnancy.  Zoster vaccine.** / 1 dose for adults aged 60 years or older.  Measles, mumps, rubella (MMR) vaccine.** / You need at least 1 dose of MMR if you were born in 1957 or later. You may also need a 2nd dose. For females of childbearing age, rubella immunity should be determined. If there is no evidence of immunity, females who are not pregnant should be vaccinated. If there is no evidence of immunity, females who are pregnant should delay immunization until after pregnancy.  Pneumococcal 13-valent conjugate (PCV13) vaccine.** / Consult your health care provider.  Pneumococcal polysaccharide (PPSV23) vaccine.** / 1 to 2 doses if you smoke cigarettes or if you have certain conditions.  Meningococcal vaccine.** / Consult your health care provider.  Hepatitis A vaccine.** / Consult your health care provider.  Hepatitis B vaccine.** / Consult your health care provider.  Haemophilus influenzae type b (Hib) vaccine.** / Consult your health care provider. Ages 65   years and over  Blood pressure check.** / Every 1 to 2 years.  Lipid and cholesterol check.** / Every 5 years beginning at age 22 years.  Lung cancer screening. / Every year if you are aged 73-80 years and have a 30-pack-year history of smoking and currently smoke or have quit within the past 15 years. Yearly screening is stopped once you have quit smoking for at least 15 years or develop a health problem that would prevent you from having lung cancer  treatment.  Clinical breast exam.** / Every year after age 4 years.  BRCA-related cancer risk assessment.** / For women who have family members with a BRCA-related cancer (breast, ovarian, tubal, or peritoneal cancers).  Mammogram.** / Every year beginning at age 40 years and continuing for as long as you are in good health. Consult with your health care provider.  Pap test.** / Every 3 years starting at age 9 years through age 34 or 91 years with 3 consecutive normal Pap tests. Testing can be stopped between 65 and 70 years with 3 consecutive normal Pap tests and no abnormal Pap or HPV tests in the past 10 years.  HPV screening.** / Every 3 years from ages 57 years through ages 64 or 45 years with a history of 3 consecutive normal Pap tests. Testing can be stopped between 65 and 70 years with 3 consecutive normal Pap tests and no abnormal Pap or HPV tests in the past 10 years.  Fecal occult blood test (FOBT) of stool. / Every year beginning at age 15 years and continuing until age 17 years. You may not need to do this test if you get a colonoscopy every 10 years.  Flexible sigmoidoscopy or colonoscopy.** / Every 5 years for a flexible sigmoidoscopy or every 10 years for a colonoscopy beginning at age 86 years and continuing until age 71 years.  Hepatitis C blood test.** / For all people born from [birth year] through 1967 and any individual with known risks for hepatitis C.  Osteoporosis screening.** / A one-time screening for women ages 83 years and over and women at risk for fractures or osteoporosis.  Skin self-exam. / Monthly.  Influenza vaccine. / Every year.  Tetanus, diphtheria, and acellular pertussis (Tdap/Td) vaccine.** / 1 dose of Td every 10 years.  Varicella vaccine.** / Consult your health care provider.  Zoster vaccine.** / 1 dose for adults aged 61 years or older.  Pneumococcal 13-valent conjugate (PCV13) vaccine.** / Consult your health care provider.  Pneumococcal  polysaccharide (PPSV23) vaccine.** / 1 dose for all adults aged 28 years and older.  Meningococcal vaccine.** / Consult your health care provider.  Hepatitis A vaccine.** / Consult your health care provider.  Hepatitis B vaccine.** / Consult your health care provider.  Haemophilus influenzae type b (Hib) vaccine.** / Consult your health care provider. ** Family history and personal history of risk and conditions may change your health care provider's recommendations. Document Released: 02/06/2002 Document Revised: 04/27/2014 Document Reviewed: 05/08/2011 [facility] Patient Information 2015 [place], [place]. This information is not intended to replace advice given to you by your health care provider. Make sure you discuss any questions you have with your health care provider.

## 2015-09-03 ENCOUNTER — Encounter: Payer: Self-pay | Admitting: Internal Medicine

## 2015-09-05 ENCOUNTER — Encounter: Payer: Self-pay | Admitting: Internal Medicine
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
## 2015-09-06 ENCOUNTER — Encounter: Payer: Self-pay | Admitting: Internal Medicine

## 2015-09-06 LAB — HSV(HERPES SMPLX)ABS-I+II(IGG+IGM)-BLD
HERPES SIMPLEX VRS I-IGM AB (EIA): 1.44 {index} — AB
HSV 2 Glycoprotein G Ab, IgG: <0.1 IV

## 2015-09-06 LAB — CYTOLOGY - PAP

## 2015-09-07 ENCOUNTER — Encounter: Payer: Self-pay | Admitting: Internal Medicine

## 2015-09-07 NOTE — Addendum Note (Signed)
Addended by: Etta Grandchild on: 09/07/2015 02:21 PM   Modules accepted: Kipp Brood

## 2016-02-07 ENCOUNTER — Emergency Department (HOSPITAL_COMMUNITY)
Admission: EM | Admit: 2016-02-07 | Discharge: 2016-02-07 | Disposition: A | Discharge status: Home / Self Care | Payer: BLUE CROSS/BLUE SHIELD | Attending: Emergency Medicine | Admitting: Emergency Medicine

## 2016-02-07 ENCOUNTER — Encounter (HOSPITAL_COMMUNITY): Payer: Self-pay | Admitting: *Deleted

## 2016-02-07 DIAGNOSIS — Z8679 Personal history of other diseases of the circulatory system: Secondary | ICD-10-CM | POA: Diagnosis not present

## 2016-02-07 DIAGNOSIS — R112 Nausea with vomiting, unspecified: Secondary | ICD-10-CM | POA: Diagnosis present

## 2016-02-07 DIAGNOSIS — Z862 Personal history of diseases of the blood and blood-forming organs and certain disorders involving the immune mechanism: Secondary | ICD-10-CM | POA: Diagnosis not present

## 2016-02-07 DIAGNOSIS — J45909 Unspecified asthma, uncomplicated: Secondary | ICD-10-CM | POA: Diagnosis not present

## 2016-02-07 DIAGNOSIS — B349 Viral infection, unspecified: Secondary | ICD-10-CM | POA: Insufficient documentation

## 2016-02-07 DIAGNOSIS — A084 Viral intestinal infection, unspecified: Secondary | ICD-10-CM | POA: Diagnosis not present

## 2016-02-07 MED ORDER — DEXAMETHASONE SODIUM PHOSPHATE 10 MG/ML IJ SOLN
10.0000 mg | Freq: Once | INTRAMUSCULAR | Status: AC
  Administered 2016-02-07: 10 mg via INTRAMUSCULAR
  Filled 2016-02-07: qty 1

## 2016-02-07 MED ORDER — ONDANSETRON 4 MG PO TBDP: ORAL_TABLET | ORAL | Status: AC

## 2016-02-07 NOTE — ED Provider Notes (Signed)
CSN: [id number]     Arrival date & time 02/07/16  1918 History  By signing my name below, I, Lindsey Webster, attest that this documentation has been prepared under the direction and in the presence of Lindsey Morn, NP. Electronically Signed: Elon Webster ED Scribe. 02/07/2016. 7:30 PM.    Chief Complaint  Patient presents with  . Nausea  . Emesis   The history is provided by the patient. No language interpreter was used.   HPI Comments: Lindsey Webster is a 25 y.o. female who presents to the Emergency Department complaining of nausea, vomiting, diarrhea and abdominal cramping onset two days ago.  Associated symptoms include chills.  The patient reports decreased fluid intake but is tolerating food and fluids including Gatorade and pedialyte.  Initially, she attributed this episode to "food poisoning," however, she also notes a sick contact with her nephew the day before onset.  She denies cough.    PCP: Sanda Linger, MD   Past Medical History  Diagnosis Date  . Asthma   . Anemia   . Migraine 2012   No past surgical history on file. Family History  Problem Relation Age of Onset  . Colon cancer Mother   . Arthritis Father   . Stroke Maternal Grandfather   . Heart disease Paternal Grandmother    Social History  Substance Use Topics  . Smoking status: Never Smoker   . Smokeless tobacco: Never Used  . Alcohol Use: 0.6 oz/week    1 Shots of liquor per week   OB History    No data available     Review of Systems  Constitutional: Positive for chills.  Gastrointestinal: Positive for nausea, vomiting and abdominal pain.  All other systems reviewed and are negative.  A complete 10 system review of systems was obtained and all systems are negative except as noted in the HPI and PMH.    Allergies  Review of patient's allergies indicates no known allergies.  Home Medications   Prior to Admission medications   Not on File   BP 120/78 mmHg  Pulse 80  Temp(Src) 98.2 F (36.8 C)  (Oral)  Resp 16  SpO2 100% Physical Exam  Constitutional: She is oriented to person, place, and time. She appears well-developed and well-nourished. No distress.  HENT:  Head: Normocephalic and atraumatic.  Eyes: Conjunctivae and EOM are normal.  Neck: Neck supple. No tracheal deviation present.  Cardiovascular: Normal rate.   Pulmonary/Chest: Effort normal. No respiratory distress.  Abdominal: Soft. Bowel sounds are normal.  Musculoskeletal: Normal range of motion.  Neurological: She is alert and oriented to person, place, and time.  Skin: Skin is warm and dry.  Psychiatric: She has a normal mood and affect. Her behavior is normal.  Nursing note and vitals reviewed.   ED Course  Procedures (including critical care time)  DIAGNOSTIC STUDIES: Oxygen Saturation is 100% on RA, normal by my interpretation.    COORDINATION OF CARE:  7:50 PM Will order anti-emetic and Decadron.  Patient counseled on importance of hydration and may use Tylenol/ibuprofen as needed. Patient acknowledges and agrees with plan.    Labs Review Labs Reviewed - No data to display  Imaging Review No results found. I have personally reviewed and evaluated these images and lab results as part of my medical decision-making.   EKG Interpretation None      MDM   Final diagnoses:  None    Viral gastroenteritis. Symptoms improving, is tolerating oral intake and diarrhea has subsided. Care instructions  provided. Return precautions discussed.  I personally performed the services described in this documentation, which was scribed in my presence. The recorded information has been reviewed and is accurate.    Lindsey Morn, NP 02/08/16 [id number]  Lindsey Octave, MD 02/08/16 [phone]

## 2016-02-07 NOTE — ED Notes (Signed)
Pt c/o NVD and weakness since Saturday. Reports thinking she may have had food poisoning, was able to eat fruit and a sub today. C/o weakness

## 2016-02-07 NOTE — Discharge Instructions (Signed)

## 2016-07-17 IMAGING — CR DG CHEST 2V
2 series · 2 of 2 positions shown · non-contrast
Comparison: April 23, 2013

CLINICAL DATA: Five day history chest pain with cough and
difficulty breathing

EXAM:
CHEST  2 VIEW

[view not recorded (1 of 2)]
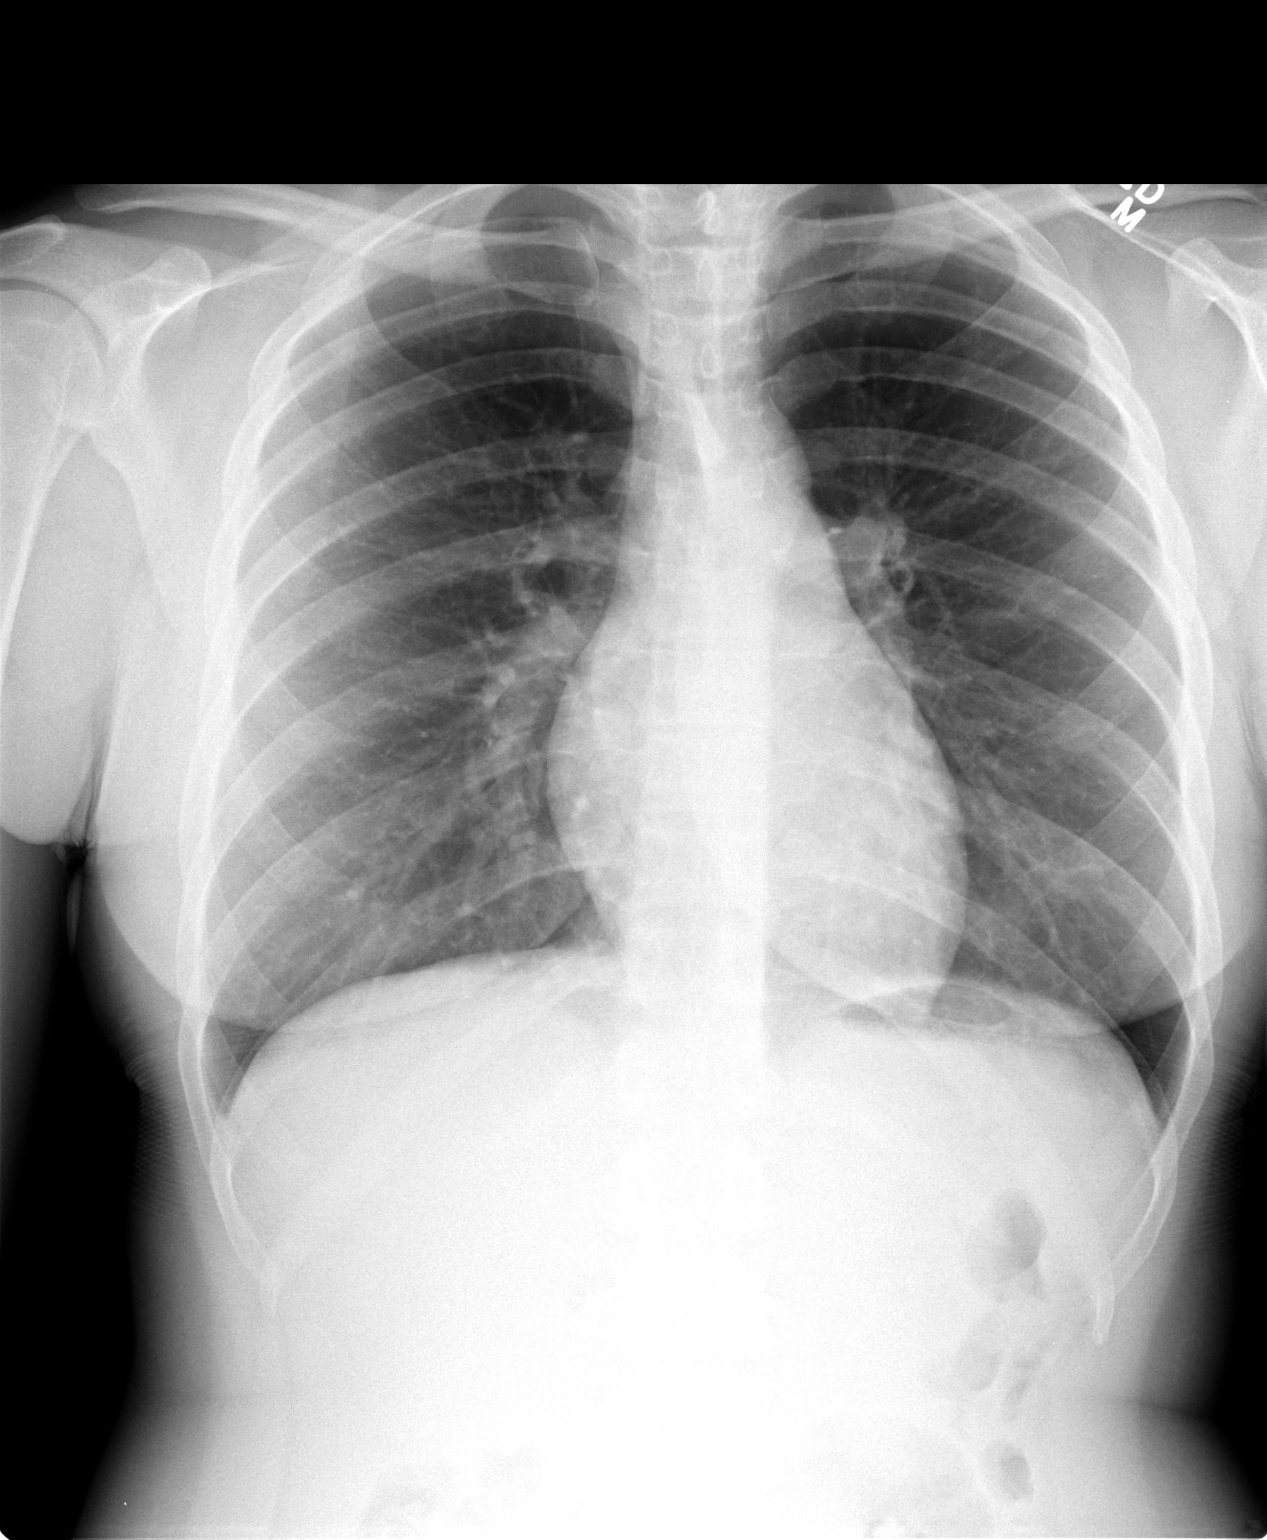

[view not recorded (2 of 2)]
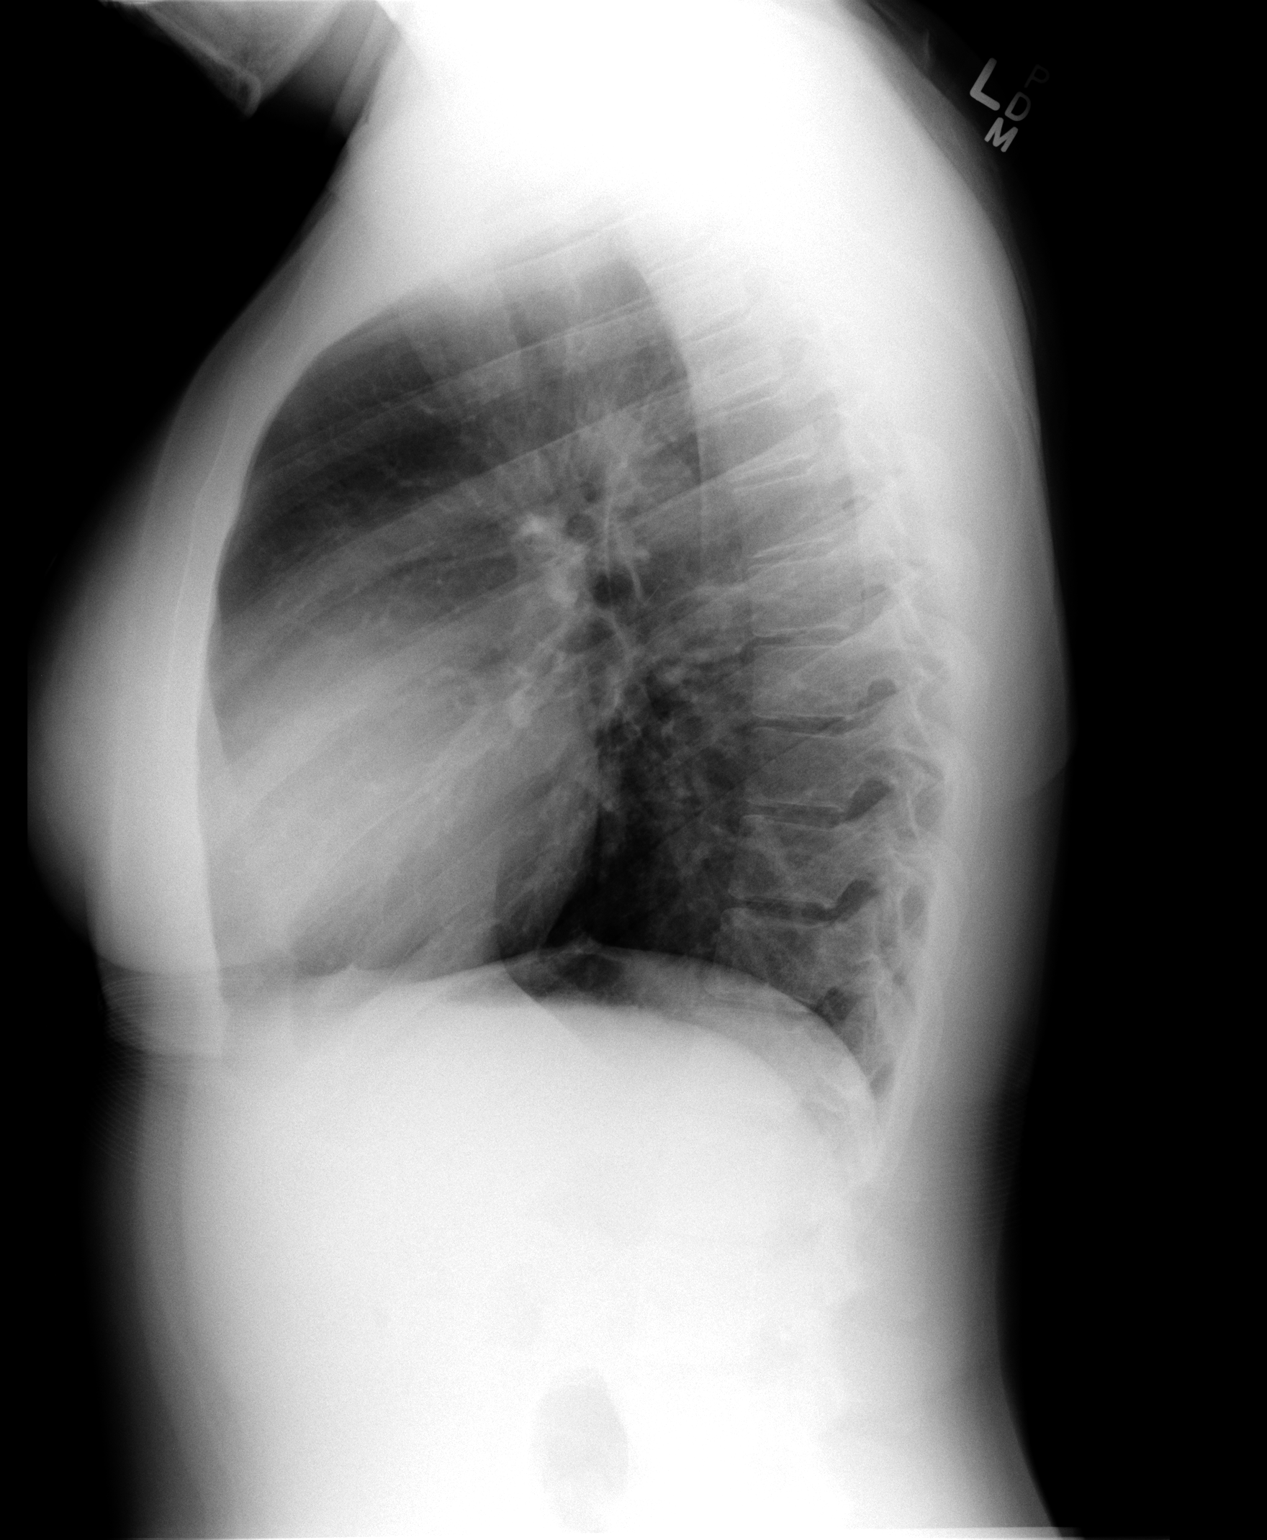

[2 of 2 positions shown; findings below may reference images not displayed]

FINDINGS: There is no edema or consolidation. Heart size and pulmonary
vascularity are normal. No adenopathy. No pneumothorax. No bone
lesions.
IMPRESSION: No edema or consolidation.

## 2016-07-25 ENCOUNTER — Encounter: Payer: BLUE CROSS/BLUE SHIELD | Admitting: Internal Medicine

## 2016-07-26 ENCOUNTER — Telehealth: Payer: Self-pay | Admitting: Internal Medicine

## 2016-07-26 NOTE — Telephone Encounter (Signed)
Patient no showed for CPE on 8/1.  Please advise.

## 2016-07-26 NOTE — Telephone Encounter (Signed)
Call her to reschedule

## 2016-07-27 NOTE — Telephone Encounter (Signed)
Left patient vm to reschedule.
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
## 2023-10-28 ENCOUNTER — Encounter (HOSPITAL_BASED_OUTPATIENT_CLINIC_OR_DEPARTMENT_OTHER): Payer: Self-pay | Admitting: *Deleted

## 2023-10-28 ENCOUNTER — Emergency Department (HOSPITAL_BASED_OUTPATIENT_CLINIC_OR_DEPARTMENT_OTHER): Payer: BC Managed Care – PPO

## 2023-10-28 ENCOUNTER — Emergency Department (HOSPITAL_BASED_OUTPATIENT_CLINIC_OR_DEPARTMENT_OTHER)
Admission: EM | Admit: 2023-10-28 | Discharge: 2023-10-28 | Disposition: A | Discharge status: Home / Self Care | Payer: BC Managed Care – PPO | Attending: Emergency Medicine | Admitting: Emergency Medicine

## 2023-10-28 ENCOUNTER — Other Ambulatory Visit: Payer: Self-pay

## 2023-10-28 DIAGNOSIS — M79642 Pain in left hand: Secondary | ICD-10-CM | POA: Insufficient documentation

## 2023-10-28 DIAGNOSIS — M25531 Pain in right wrist: Secondary | ICD-10-CM | POA: Diagnosis not present

## 2023-10-28 DIAGNOSIS — Y92410 Unspecified street and highway as the place of occurrence of the external cause: Secondary | ICD-10-CM | POA: Insufficient documentation

## 2023-10-28 DIAGNOSIS — M549 Dorsalgia, unspecified: Secondary | ICD-10-CM | POA: Diagnosis present

## 2023-10-28 DIAGNOSIS — M79641 Pain in right hand: Secondary | ICD-10-CM | POA: Diagnosis not present

## 2023-10-28 DIAGNOSIS — T148XXA Other injury of unspecified body region, initial encounter: Secondary | ICD-10-CM

## 2023-10-28 DIAGNOSIS — S39012A Strain of muscle, fascia and tendon of lower back, initial encounter: Secondary | ICD-10-CM | POA: Diagnosis not present

## 2023-10-28 MED ORDER — ACETAMINOPHEN 500 MG PO TABS
1000.0000 mg | ORAL_TABLET | Freq: Once | ORAL | Status: AC
  Administered 2023-10-28: 1000 mg via ORAL
  Filled 2023-10-28: qty 2

## 2023-10-28 MED ORDER — CYCLOBENZAPRINE HCL 10 MG PO TABS: 5.0000 mg | ORAL_TABLET | Freq: Every day | ORAL | 0 refills | Status: AC

## 2023-10-28 NOTE — ED Notes (Signed)
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
## 2023-10-28 NOTE — ED Notes (Signed)
ED Provider at bedside. 
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
